# Patient Record
Sex: Male | Born: 1950 | Race: White | Hispanic: No | Marital: Married | Smoking: Never smoker
Health system: Southern US, Community
[De-identification: ages and names within clinical notes are randomized; demographics above are authoritative.]

## PROBLEM LIST (undated history)

## (undated) ENCOUNTER — Emergency Department (HOSPITAL_BASED_OUTPATIENT_CLINIC_OR_DEPARTMENT_OTHER): Admission: EM | Payer: Self-pay

## (undated) DIAGNOSIS — C801 Malignant (primary) neoplasm, unspecified: Secondary | ICD-10-CM

## (undated) DIAGNOSIS — R7303 Prediabetes: Secondary | ICD-10-CM

## (undated) DIAGNOSIS — N200 Calculus of kidney: Secondary | ICD-10-CM

## (undated) DIAGNOSIS — F419 Anxiety disorder, unspecified: Secondary | ICD-10-CM

## (undated) DIAGNOSIS — R0602 Shortness of breath: Secondary | ICD-10-CM

## (undated) DIAGNOSIS — J189 Pneumonia, unspecified organism: Secondary | ICD-10-CM

## (undated) DIAGNOSIS — I10 Essential (primary) hypertension: Secondary | ICD-10-CM

## (undated) DIAGNOSIS — K219 Gastro-esophageal reflux disease without esophagitis: Secondary | ICD-10-CM

## (undated) DIAGNOSIS — J45909 Unspecified asthma, uncomplicated: Secondary | ICD-10-CM

## (undated) HISTORY — PX: MRI: SHX5353

## (undated) HISTORY — PX: ESOPHAGOGASTRODUODENOSCOPY (EGD) WITH ESOPHAGEAL DILATION: SHX5812

## (undated) HISTORY — PX: CYSTOSCOPY: SHX5120

---

## 2013-03-03 ENCOUNTER — Encounter: Payer: Self-pay | Admitting: Gastroenterology

## 2013-03-07 ENCOUNTER — Ambulatory Visit: Payer: Self-pay | Admitting: Gastroenterology

## 2013-03-10 ENCOUNTER — Ambulatory Visit (HOSPITAL_BASED_OUTPATIENT_CLINIC_OR_DEPARTMENT_OTHER)
Admission: EM | Admit: 2013-03-10 | Discharge: 2013-03-10 | Disposition: A | Discharge status: Other Acute Inpatient Hospital | Payer: BC Managed Care – PPO | Attending: Emergency Medicine | Admitting: Emergency Medicine

## 2013-03-10 ENCOUNTER — Encounter (HOSPITAL_COMMUNITY): Payer: Self-pay | Admitting: *Deleted

## 2013-03-10 ENCOUNTER — Emergency Department (HOSPITAL_BASED_OUTPATIENT_CLINIC_OR_DEPARTMENT_OTHER)
Admission: EM | Admit: 2013-03-10 | Discharge: 2013-03-10 | Disposition: A | Discharge status: Other Acute Inpatient Hospital | Payer: BC Managed Care – PPO | Source: Home / Self Care | Attending: Emergency Medicine | Admitting: Emergency Medicine

## 2013-03-10 ENCOUNTER — Encounter (HOSPITAL_BASED_OUTPATIENT_CLINIC_OR_DEPARTMENT_OTHER): Payer: Self-pay | Admitting: Family Medicine

## 2013-03-10 ENCOUNTER — Encounter (HOSPITAL_COMMUNITY)
Admission: EM | Disposition: A | Discharge status: Other Acute Inpatient Hospital | Payer: Self-pay | Source: Home / Self Care | Attending: Gastroenterology

## 2013-03-10 ENCOUNTER — Other Ambulatory Visit: Payer: Self-pay | Admitting: Gastroenterology

## 2013-03-10 DIAGNOSIS — R1310 Dysphagia, unspecified: Secondary | ICD-10-CM

## 2013-03-10 DIAGNOSIS — R1110 Vomiting, unspecified: Secondary | ICD-10-CM | POA: Insufficient documentation

## 2013-03-10 DIAGNOSIS — T18108A Unspecified foreign body in esophagus causing other injury, initial encounter: Secondary | ICD-10-CM | POA: Insufficient documentation

## 2013-03-10 DIAGNOSIS — IMO0002 Reserved for concepts with insufficient information to code with codable children: Secondary | ICD-10-CM | POA: Insufficient documentation

## 2013-03-10 DIAGNOSIS — K209 Esophagitis, unspecified without bleeding: Secondary | ICD-10-CM | POA: Insufficient documentation

## 2013-03-10 HISTORY — DX: Calculus of kidney: N20.0

## 2013-03-10 HISTORY — DX: Unspecified asthma, uncomplicated: J45.909

## 2013-03-10 HISTORY — DX: Essential (primary) hypertension: I10

## 2013-03-10 HISTORY — PX: ESOPHAGOGASTRODUODENOSCOPY (EGD) WITH ESOPHAGEAL DILATION: SHX5812

## 2013-03-10 HISTORY — DX: Gastro-esophageal reflux disease without esophagitis: K21.9

## 2013-03-10 HISTORY — DX: Shortness of breath: R06.02

## 2013-03-10 SURGERY — ESOPHAGOGASTRODUODENOSCOPY (EGD) WITH ESOPHAGEAL DILATION
Anesthesia: Moderate Sedation

## 2013-03-10 MED ORDER — FENTANYL CITRATE 0.05 MG/ML IJ SOLN
INTRAMUSCULAR | Status: AC
  Filled 2013-03-10: qty 4

## 2013-03-10 MED ORDER — MIDAZOLAM HCL 10 MG/2ML IJ SOLN
INTRAMUSCULAR | Status: DC | PRN
  Administered 2013-03-10 (×5): 2 mg via INTRAVENOUS

## 2013-03-10 MED ORDER — DIPHENHYDRAMINE HCL 50 MG/ML IJ SOLN
INTRAMUSCULAR | Status: AC
  Filled 2013-03-10: qty 1

## 2013-03-10 MED ORDER — SODIUM CHLORIDE 0.9 % IV SOLN
INTRAVENOUS | Status: DC
  Administered 2013-03-10: 50 mL via INTRAVENOUS
  Administered 2013-03-10: 500 mL via INTRAVENOUS

## 2013-03-10 MED ORDER — FENTANYL CITRATE 0.05 MG/ML IJ SOLN
INTRAMUSCULAR | Status: DC | PRN
  Administered 2013-03-10 (×4): 25 ug via INTRAVENOUS

## 2013-03-10 MED ORDER — DIPHENHYDRAMINE HCL 50 MG/ML IJ SOLN
INTRAMUSCULAR | Status: DC | PRN
  Administered 2013-03-10 (×2): 25 mg via INTRAVENOUS

## 2013-03-10 MED ORDER — BUTAMBEN-TETRACAINE-BENZOCAINE 2-2-14 % EX AERO
INHALATION_SPRAY | CUTANEOUS | Status: DC | PRN
  Administered 2013-03-10: 2 via TOPICAL

## 2013-03-10 MED ORDER — MIDAZOLAM HCL 10 MG/2ML IJ SOLN
INTRAMUSCULAR | Status: AC
  Filled 2013-03-10: qty 4

## 2013-03-10 NOTE — H&P (Signed)
  Pleasant 62 year old gentleman seen at the endoscopy unit today on referral from the [facility], because of progressive dysphagia symptoms over the past 2 weeks. He was seen in my office by my partner, Dr. Dulce Sellar, just a few days ago because the symptoms and outpatient dilatation had been arranged, but realistically, he's been unable to sleep the past several nights because of gagging and regurgitation.  He is experiencing difficulty with both solids and liquids. He has had a 60 pound weight loss in the past several months, some of which is intentional by eating healthier. He has not had radiographic evaluation of his esophagus. Roughly 20 years ago, he had endoscopic dilatation on several occasions, in association with recurrent food impactions, but had been done well until a couple of months ago when his dysphagia symptoms became more pronounced and particularly bad over the past couple of weeks. He does not really suffer from indigestion or heartburn but has had a lot of regurgitation which could be obstructive in origin.   Past medical history:  Allergies: None  Outpatient medications: Flovent inhaler, when necessary albuterol inhaler  Chronic medical illnesses asthma. Prior history of esophageal ring or stricture causing dysphagia and necessitating dilatation. History of kidney stone.  Physical exam:  A stocky, pleasant, articulate Caucasian male in no distress. Anicteric no pallor. Chest pertinent for multiple rhonchi, especially at the right base, but there is no respiratory distress and only an occasional cough during the exam. Heart unremarkable. Abdomen obese but without guarding, mass effect, or tenderness.  Impression: Esophageal dysphagia. Weight loss.  Plan: Upper endoscopy with possible retrieval of retained food if foreign body is present, possible esophageal dilatation if a stricture is confirmed and the mucosa is not too inflamed, and possible biopsies  if there is evidence of a mass effect. The risks of this procedure with particular reference to aspiration, esophageal perforation, cardiopulmonary problems, or bleeding were discussed with the patient and his wife and they are agreeable to proceed. They understand that, depending on what we find, we may not be able to achieve definitive dilatation today.  Florencia Reasons, M.D. [phone]

## 2013-03-10 NOTE — ED Provider Notes (Signed)
   History    CSN: [id number] Arrival date & time 03/10/13  1140  First MD Initiated Contact with Patient 03/10/13 1156     Chief Complaint  Patient presents with  . Dysphagia   (Consider location/radiation/quality/duration/timing/severity/associated sxs/prior Treatment) The history is provided by the patient.   Pt presents to the ED for difficulty swallowing.  Pt is established with [facility], Dr. Dulce Sellar-- states he scheduled for esophageal dilation on Friday which he has had done before.  Over the past few days he has felt miserable-- unable to keep food down, unable to lay flat to sleep, continuously vomiting, etc.  Emesis is non-bloody, non-bilious.  States when he attempts to lay flat to sleep, he feels like he is going to suffocate.  No fevers, sweats, or chills.  Contacted his GI office this morning regarding sx and was told to come to the ED to possibly have procedure done earlier.  No past medical history on file. No past surgical history on file. No family history on file. History  Substance Use Topics  . Smoking status: Not on file  . Smokeless tobacco: Not on file  . Alcohol Use: Not on file    Review of Systems  HENT: Positive for trouble swallowing.   Gastrointestinal: Positive for vomiting.  All other systems reviewed and are negative.    Allergies  Review of patient's allergies indicates not on file.  Home Medications  No current outpatient prescriptions on file. BP 140/86  Pulse 74  Temp(Src) 97.9 F (36.6 C) (Oral)  Resp 18  Ht 6\' 2"  (1.88 m)  Wt 268 lb (121.564 kg)  BMI 34.39 kg/m2  SpO2 99%  Physical Exam  Nursing note and vitals reviewed. Constitutional: He is oriented to person, place, and time. He appears well-developed and well-nourished.  HENT:  Head: Normocephalic and atraumatic.  Mouth/Throat: Uvula is midline, oropharynx is clear and moist and mucous membranes are normal. No oropharyngeal exudate, posterior oropharyngeal edema, posterior  oropharyngeal erythema or tonsillar abscesses.  No visible oropharyngeal obstruction or food bolus; no oropharyngeal edema, handling secretions appropriately  Eyes: Conjunctivae and EOM are normal. Pupils are equal, round, and reactive to light.  Neck: Normal range of motion. Neck supple.  Cardiovascular: Normal rate, regular rhythm and normal heart sounds.   Pulmonary/Chest: Effort normal and breath sounds normal.  Abdominal: Soft. Bowel sounds are normal. There is no tenderness. There is no guarding.  Musculoskeletal: Normal range of motion.  Neurological: He is alert and oriented to person, place, and time.  Skin: Skin is warm and dry.  Psychiatric: He has a normal mood and affect.    ED Course  Procedures (including critical care time) Labs Reviewed - No data to display No results found.  1. Dysphagia     MDM   Consulted GI, Dr. Matthias Hughs-- they will do his procedure today at 3pm at [facility] long endo suite. Discussed this with pt, he would like to go by POV.  Given address and phone number for endo suite if any problems occur.  Garlon Hatchet, PA-C 03/10/13 1309

## 2013-03-10 NOTE — ED Notes (Signed)
Pt c/o difficulty swallowing and is scheduled for outpt dilation with Dr. Dulce Sellar at [facility] this Friday. Pt sts he would like to get procedure done sooner due to inability to keep food down and unable to sleep.

## 2013-03-10 NOTE — Op Note (Signed)
[facility] [address] [place], [address]   ENDOSCOPY PROCEDURE REPORT  PATIENT: Alfred Blair  MR#: [id number] BIRTHDATE: 1951-01-14 , 61  yrs. old GENDER: Male ENDOSCOPIST:Rashawnda Gaba, MD REFERRED BY:  Dr. Ihor Dow, Dr. Dulce Sellar PROCEDURE DATE:  03/10/2013 PROCEDURE:      Upper endoscopy, Retrieval of esophageal foreign body, biopsies ASA CLASS: INDICATIONS:   severe dysphagia symptoms, accelerated over the past several months and in particular the past several weeks. Barely able to get liquids down. His approximately 20 years status post previous esophageal dilatation performed in [place] MEDICATION:    Benadryl 50 mg IV, fentanyl 100 mcg IV, Versed 10 mg IV TOPICAL ANESTHETIC:    Cetacaine spray x2  DESCRIPTION OF PROCEDURE:  the patient came over from the [facility]. I discussed the procedure with the patient and his wife, including possible complications. He provided written consent. A fluid bolus was given to help prevent hypotension, because it was likely he had not had adequate liquid intake in recent days. Time out was performed. He was then sedated with the above medications prior to and during the course of the procedure, without clinical instability.  The Pentax video endoscope was passed under direct vision. The larynx looked normal. The esophagus was entered without difficulty.  Immediately upon entering the esophagus, it was noted that there was a fair amount of amorphous food debris in the esophagus, which was meticulously removed by approximately 6 passages of a Roth retrieval basket, after which the small amount of remaining food could be pushed into the stomach without difficulty.  Careful inspection of the region of the gastroesophageal junction showed no evidence of a ring or stricture. The scope met rubbery, smooth resistance as it passed through the area of the lower esophageal sphincter muscle,  without any discrete "pop" or "click" to suggest passage or fracture of an esophageal ring.  There was slight variation in the color of the distal esophageal mucosa, raising the question of possible Barrett's esophagus, so biopsies were obtained from the area that had mucosa that was somewhat darker in color. It was not the classic appearance of Barrett's, however.  I also obtained biopsies 5 cm above the GE junction and in the midesophagus at approximately 30 cm, to check for eosinophilic esophagitis.  The stomach contained a small amount of bile but was otherwise normal. A retroflexed view of the cardia was unremarkable. Specifically, there was no evidence of a cardia tumor to be causing this patient secondary achalasia.  The pylorus, duodenal bulb, and second duodenum looked normal.    I elected not to do esophageal dilatation because of the absence of any demonstrable stricture and the concern of possible alternative etiologies including eosinophilic esophagitis which would put the patient at greater risk for esophageal perforation if dilatation were performed.  The patient tolerated the procedure very well, despite a previous history of having been combative during his previous endoscopy in [place] (by his report).    COMPLICATIONS: None  ENDOSCOPIC IMPRESSION:  1. Esophageal food impaction, successfully removed 2. Findings at the GE junction suggestive of possible hypertonicity of the lower esophageal sphincter, or perhaps even achalasia. However, the patient's esophagus was not dilated. 3. Mucosal color changes in the distal esophagus suggestive of possible Barrett's esophagus, path pending. 4. Biopsies obtained to look for eosinophilic esophagitis, although the patient did not have multiple "stacked rings" in the esophageal body.   RECOMMENDATIONS:  1. Liquid diet until further notice 2. Barium  swallow with tablet in the next day or 2, to get a better idea  of his esophageal motility and emptying dynamics 3. Await pathology results on current biopsies 4. The patient will followup with Dr. Dulce Sellar to decide on the next course of action, depending on the above results. Realistically, he probably cannot have esophageal dilatation (if it is felt to be needed) until the esophageal biopsy sites have had a chance to heal    _______________________________ eSigned:  Bernette Redbird, MD 03/10/2013 5:10 PM    PATIENT NAME:  Alfred Blair MR#: [id number]

## 2013-03-11 ENCOUNTER — Encounter (HOSPITAL_COMMUNITY): Payer: Self-pay | Admitting: Gastroenterology

## 2013-03-11 NOTE — ED Provider Notes (Signed)
Medical screening examination/treatment/procedure(s) were performed by non-physician practitioner and as supervising physician I was immediately available for consultation/collaboration.  Geoffery Lyons, MD 03/11/13 [phone]

## 2013-03-13 ENCOUNTER — Other Ambulatory Visit (HOSPITAL_COMMUNITY): Payer: Self-pay | Admitting: Gastroenterology

## 2013-03-13 DIAGNOSIS — R1310 Dysphagia, unspecified: Secondary | ICD-10-CM

## 2013-03-14 ENCOUNTER — Other Ambulatory Visit (HOSPITAL_COMMUNITY): Payer: BC Managed Care – PPO

## 2013-03-17 ENCOUNTER — Ambulatory Visit (HOSPITAL_COMMUNITY)
Admission: RE | Admit: 2013-03-17 | Discharge: 2013-03-17 | Disposition: A | Discharge status: Home / Self Care | Payer: BC Managed Care – PPO | Source: Ambulatory Visit | Attending: Gastroenterology | Admitting: Gastroenterology

## 2013-03-17 DIAGNOSIS — R1110 Vomiting, unspecified: Secondary | ICD-10-CM | POA: Insufficient documentation

## 2013-03-17 DIAGNOSIS — R1310 Dysphagia, unspecified: Secondary | ICD-10-CM

## 2013-04-15 ENCOUNTER — Telehealth: Payer: Self-pay | Admitting: Gastroenterology

## 2013-04-15 NOTE — Telephone Encounter (Signed)
GI records received and declined referral at this time

## 2013-04-17 ENCOUNTER — Telehealth: Payer: Self-pay | Admitting: Gastroenterology

## 2013-04-17 NOTE — Telephone Encounter (Signed)
rec'd records from [place], [place] [address] to Dr Jarold Motto

## 2018-09-03 DIAGNOSIS — Z6841 Body Mass Index (BMI) 40.0 and over, adult: Secondary | ICD-10-CM | POA: Diagnosis not present

## 2018-09-03 DIAGNOSIS — I10 Essential (primary) hypertension: Secondary | ICD-10-CM | POA: Diagnosis not present

## 2018-09-03 DIAGNOSIS — J00 Acute nasopharyngitis [common cold]: Secondary | ICD-10-CM | POA: Diagnosis not present

## 2018-09-03 DIAGNOSIS — J45901 Unspecified asthma with (acute) exacerbation: Secondary | ICD-10-CM | POA: Diagnosis not present

## 2018-09-03 DIAGNOSIS — E559 Vitamin D deficiency, unspecified: Secondary | ICD-10-CM | POA: Diagnosis not present

## 2018-09-03 DIAGNOSIS — Z131 Encounter for screening for diabetes mellitus: Secondary | ICD-10-CM | POA: Diagnosis not present

## 2018-09-03 DIAGNOSIS — Z13220 Encounter for screening for lipoid disorders: Secondary | ICD-10-CM | POA: Diagnosis not present

## 2018-10-11 DIAGNOSIS — J069 Acute upper respiratory infection, unspecified: Secondary | ICD-10-CM | POA: Diagnosis not present

## 2018-10-11 DIAGNOSIS — I10 Essential (primary) hypertension: Secondary | ICD-10-CM | POA: Diagnosis not present

## 2018-11-02 ENCOUNTER — Encounter (HOSPITAL_COMMUNITY): Payer: Self-pay

## 2018-11-02 ENCOUNTER — Emergency Department (HOSPITAL_COMMUNITY)
Admission: EM | Admit: 2018-11-02 | Discharge: 2018-11-02 | Disposition: A | Discharge status: Home / Self Care | Payer: Medicare Other | Attending: Emergency Medicine | Admitting: Emergency Medicine

## 2018-11-02 ENCOUNTER — Other Ambulatory Visit: Payer: Self-pay

## 2018-11-02 ENCOUNTER — Emergency Department (HOSPITAL_COMMUNITY): Payer: Medicare Other

## 2018-11-02 DIAGNOSIS — J45909 Unspecified asthma, uncomplicated: Secondary | ICD-10-CM | POA: Insufficient documentation

## 2018-11-02 DIAGNOSIS — J189 Pneumonia, unspecified organism: Secondary | ICD-10-CM | POA: Diagnosis not present

## 2018-11-02 DIAGNOSIS — R05 Cough: Secondary | ICD-10-CM | POA: Diagnosis not present

## 2018-11-02 DIAGNOSIS — Z87442 Personal history of urinary calculi: Secondary | ICD-10-CM | POA: Diagnosis not present

## 2018-11-02 MED ORDER — HYDROCOD POLST-CPM POLST ER 10-8 MG/5ML PO SUER: 5.0000 mL | Freq: Every evening | ORAL | 0 refills | Status: DC | PRN

## 2018-11-02 MED ORDER — DOXYCYCLINE HYCLATE 100 MG PO CAPS: 100.0000 mg | ORAL_CAPSULE | Freq: Two times a day (BID) | ORAL | 0 refills | Status: DC

## 2018-11-02 MED ORDER — ALBUTEROL SULFATE (2.5 MG/3ML) 0.083% IN NEBU
5.0000 mg | INHALATION_SOLUTION | Freq: Once | RESPIRATORY_TRACT | Status: AC
  Administered 2018-11-02: 5 mg via RESPIRATORY_TRACT
  Filled 2018-11-02: qty 6

## 2018-11-02 MED ORDER — PREDNISONE 20 MG PO TABS
60.0000 mg | ORAL_TABLET | Freq: Once | ORAL | Status: AC
  Administered 2018-11-02: 60 mg via ORAL
  Filled 2018-11-02: qty 3

## 2018-11-02 MED ORDER — PREDNISONE 50 MG PO TABS: ORAL_TABLET | ORAL | 0 refills | Status: DC

## 2018-11-02 MED ORDER — IPRATROPIUM-ALBUTEROL 0.5-2.5 (3) MG/3ML IN SOLN
3.0000 mL | Freq: Once | RESPIRATORY_TRACT | Status: AC
  Administered 2018-11-02: 3 mL via RESPIRATORY_TRACT
  Filled 2018-11-02: qty 3

## 2018-11-02 MED ORDER — HYDROCOD POLST-CPM POLST ER 10-8 MG/5ML PO SUER
5.0000 mL | Freq: Once | ORAL | Status: AC
  Administered 2018-11-02: 5 mL via ORAL
  Filled 2018-11-02: qty 5

## 2018-11-02 MED ORDER — ALBUTEROL SULFATE HFA 108 (90 BASE) MCG/ACT IN AERS
2.0000 | INHALATION_SPRAY | RESPIRATORY_TRACT | Status: DC | PRN
  Administered 2018-11-02: 2 via RESPIRATORY_TRACT
  Filled 2018-11-02: qty 6.7

## 2018-11-02 MED ORDER — BENZONATATE 100 MG PO CAPS: 100.0000 mg | ORAL_CAPSULE | Freq: Three times a day (TID) | ORAL | 0 refills | Status: DC

## 2018-11-02 NOTE — Discharge Instructions (Addendum)
The Tussionex for cough can make you sleepy. Follow up with your doctor in the next few days. If your symptoms worsen and you develop shortness of breath, chest pain, high fever that does not resolve with tylenol or ibuprofen, nausea and vomiting or other problems, return here immediately.

## 2018-11-02 NOTE — ED Provider Notes (Signed)
[facility]-EMERGENCY DEPT Provider Note   CSN: [id number] Arrival date & time: 11/02/18  1332    History   Chief Complaint Chief Complaint  Patient presents with  . URI    HPI VIJAY DURFLINGER is a 68 y.o. male who presents to the ED with cough, congestion, fever and chills.      HPI  Comes in today with a week long history, starting saturday with sinus congestion and runny nose that then progressed to chills, SOB, wheezing and productive cough with chest congestion as of Thursday. History of asthma and last dose of flovent was yesterday. States he has had pneumonia in the passed and feels as if he is progressing from a regular sinus/URI. Patient reports he was treated for a sinus infection with Augmentin just after Christmas and then a few weeks ago had a viral illness that he did not take antibiotics for. Patient reports his job requires him to travel via airplane so he is exposed to a lot of people.   Past Medical History:  Diagnosis Date  . Asthma   . GERD (gastroesophageal reflux disease)   . Kidney stone   . Shortness of breath     There are no active problems to display for this patient.   Past Surgical History:  Procedure Laterality Date  . ESOPHAGOGASTRODUODENOSCOPY (EGD) WITH ESOPHAGEAL DILATION    . ESOPHAGOGASTRODUODENOSCOPY (EGD) WITH ESOPHAGEAL DILATION N/A 03/10/2013   Procedure: ESOPHAGOGASTRODUODENOSCOPY (EGD) WITH ESOPHAGEAL DILATION;  Surgeon: Florencia Reasons, MD;  Location: [facility] ENDOSCOPY;  Service: Endoscopy;  Laterality: N/A;  possible through-the-scope esophageal dilatation, and/or removal of foreign body        Home Medications    Prior to Admission medications   Medication Sig Start Date End Date Taking? Authorizing Provider  albuterol (PROVENTIL HFA;VENTOLIN HFA) 108 (90 BASE) MCG/ACT inhaler Inhale 2 puffs into the lungs every 6 (six) hours as needed for wheezing.    [provider]  benzonatate (TESSALON) 100 MG  capsule Take 1 capsule (100 mg total) by mouth every 8 (eight) hours. 11/02/18   Janne Napoleon, NP  chlorpheniramine-HYDROcodone [facility] ER) 10-8 MG/5ML SUER Take 5 mLs by mouth at bedtime as needed for cough. 11/02/18   Janne Napoleon, NP  doxycycline (VIBRAMYCIN) 100 MG capsule Take 1 capsule (100 mg total) by mouth 2 (two) times daily. 11/02/18   Janne Napoleon, NP  Fluticasone Propionate, Inhal, (FLOVENT IN) Inhale into the lungs.    [provider]  predniSONE (DELTASONE) 50 MG tablet Starting 11/03/2018 take one tablet po daily 11/02/18   Janne Napoleon, NP    Family History History reviewed. No pertinent family history.  Social History Social History   Tobacco Use  . Smoking status: Never Smoker  . Smokeless tobacco: Never Used  Substance Use Topics  . Alcohol use: Yes    Comment: very little  . Drug use: No     Allergies   Patient has no known allergies.   Review of Systems Review of Systems  Constitutional: Positive for chills.  HENT: Positive for congestion, rhinorrhea, sinus pressure and sore throat.   Respiratory: Positive for cough, shortness of breath and wheezing.   Gastrointestinal: Negative for diarrhea, nausea and vomiting.     Physical Exam Updated Vital Signs BP (!) 168/86 (BP Location: Left Arm)   Pulse 92   Temp 97.8 F (36.6 C) (Oral)   Resp 20   SpO2 93%   Physical Exam Vitals signs  and nursing note reviewed.  Constitutional:      General: He is not in acute distress.    Appearance: He is well-developed. He is obese.  HENT:     Head: Normocephalic.     Right Ear: Tympanic membrane normal.     Left Ear: Tympanic membrane normal.     Nose: Mucosal edema and congestion present.     Right Sinus: Maxillary sinus tenderness present.     Left Sinus: Maxillary sinus tenderness present.     Mouth/Throat:     Mouth: Mucous membranes are moist.     Pharynx: Oropharynx is clear.  Eyes:     Extraocular Movements: Extraocular movements  intact.     Conjunctiva/sclera: Conjunctivae normal.  Neck:     Musculoskeletal: Neck supple.  Cardiovascular:     Rate and Rhythm: Normal rate and regular rhythm.  Pulmonary:     Effort: Pulmonary effort is normal.     Breath sounds: Examination of the right-lower field reveals decreased breath sounds. Examination of the left-lower field reveals decreased breath sounds. Decreased breath sounds present.     Comments: Occasional wheezing. Abdominal:     Palpations: Abdomen is soft.     Tenderness: There is no abdominal tenderness.  Musculoskeletal: Normal range of motion.  Skin:    General: Skin is warm and dry.  Neurological:     Mental Status: He is alert and oriented to person, place, and time.  Psychiatric:        Mood and Affect: Mood normal.      ED Treatments / Results  Labs (all labs ordered are listed, but only abnormal results are displayed) Labs Reviewed - No data to display  Radiology Dg Chest 2 View  Result Date: 11/02/2018 CLINICAL DATA:  Fever and chills.  Productive cough. EXAM: CHEST - 2 VIEW COMPARISON:  None. FINDINGS: Heart is mildly enlarged. There is no edema or effusions. Mild bibasilar airspace disease is present. No other significant consolidation is present. The visualized soft tissues and bony thorax are within normal limits. IMPRESSION: 1. Mild bibasilar airspace disease likely reflects atelectasis. Infection is considered less likely Electronically Signed   By: Marin Roberts M.D.   On: 11/02/2018 15:26    Procedures Procedures (including critical care time)  Medications Ordered in ED Medications  albuterol (PROVENTIL HFA;VENTOLIN HFA) 108 (90 Base) MCG/ACT inhaler 2 puff (2 puffs Inhalation Given 11/02/18 1623)  chlorpheniramine-HYDROcodone (TUSSIONEX) 10-8 MG/5ML suspension 5 mL (has no administration in time range)  albuterol (PROVENTIL) (2.5 MG/3ML) 0.083% nebulizer solution 5 mg (has no administration in time range)  ipratropium-albuterol  (DUONEB) 0.5-2.5 (3) MG/3ML nebulizer solution 3 mL (3 mLs Nebulization Given 11/02/18 1420)  predniSONE (DELTASONE) tablet 60 mg (60 mg Oral Given 11/02/18 1622)   Patient reports improvement after neb treatment.   Initial Impression / Assessment and Plan / ED Course  I have reviewed the triage vital signs and the nursing notes. Dr. Adela Lank reviewed x-ray and evaluated the patient and discuss further testing (flu swab). Patient states that he does not have fever and if he develops fever and starts feeling worse he will return.   Final Clinical Impressions(s) / ED Diagnoses   Final diagnoses:  Community acquired pneumonia, unspecified laterality    ED Discharge Orders         Ordered    predniSONE (DELTASONE) 50 MG tablet     11/02/18 1602    benzonatate (TESSALON) 100 MG capsule  Every 8 hours  11/02/18 1602    chlorpheniramine-HYDROcodone (TUSSIONEX PENNKINETIC ER) 10-8 MG/5ML SUER  At bedtime PRN     11/02/18 1602    doxycycline (VIBRAMYCIN) 100 MG capsule  2 times daily     11/02/18 1628           Kerrie [place], NP 11/03/18 1014    Melene Plan, DO 11/03/18 1504

## 2018-11-02 NOTE — ED Triage Notes (Addendum)
Patient c/o sore throat that came on last weekend and became more of a sinus issue. Thursday congestion started in his chest, coughing up mucous (Green,thicck), shob, and wheezing. Patient reports using inhaler today with minimum relief.  Patient reports chills this morning.  Sore throat is now gone and just irritated when coughing.  Denies hx. Of blood clots.  Denies n/v or diarrhea.   Denies body aches or pain.   URI.   Coughing in triage.  C/O coughing keeping him up all night and not able to rest.  Able to speak in complete sentence.  A/Ox4 Ambulatory in triage.

## 2018-11-05 DIAGNOSIS — F419 Anxiety disorder, unspecified: Secondary | ICD-10-CM | POA: Diagnosis not present

## 2018-11-05 DIAGNOSIS — J45909 Unspecified asthma, uncomplicated: Secondary | ICD-10-CM | POA: Diagnosis not present

## 2018-11-05 DIAGNOSIS — R0602 Shortness of breath: Secondary | ICD-10-CM | POA: Diagnosis not present

## 2018-11-07 DIAGNOSIS — J45909 Unspecified asthma, uncomplicated: Secondary | ICD-10-CM | POA: Diagnosis not present

## 2018-11-09 DIAGNOSIS — R0689 Other abnormalities of breathing: Secondary | ICD-10-CM | POA: Diagnosis not present

## 2018-11-09 DIAGNOSIS — B348 Other viral infections of unspecified site: Secondary | ICD-10-CM | POA: Diagnosis not present

## 2018-11-09 DIAGNOSIS — R0601 Orthopnea: Secondary | ICD-10-CM | POA: Diagnosis not present

## 2018-11-09 DIAGNOSIS — R600 Localized edema: Secondary | ICD-10-CM | POA: Diagnosis not present

## 2018-11-09 DIAGNOSIS — Z20828 Contact with and (suspected) exposure to other viral communicable diseases: Secondary | ICD-10-CM | POA: Diagnosis not present

## 2018-11-09 DIAGNOSIS — J101 Influenza due to other identified influenza virus with other respiratory manifestations: Secondary | ICD-10-CM | POA: Diagnosis not present

## 2019-06-23 DIAGNOSIS — Z20828 Contact with and (suspected) exposure to other viral communicable diseases: Secondary | ICD-10-CM | POA: Diagnosis not present

## 2019-08-01 DIAGNOSIS — Z23 Encounter for immunization: Secondary | ICD-10-CM | POA: Diagnosis not present

## 2019-09-22 ENCOUNTER — Ambulatory Visit: Payer: Medicare Other | Attending: Internal Medicine

## 2019-09-22 DIAGNOSIS — Z23 Encounter for immunization: Secondary | ICD-10-CM

## 2019-09-22 NOTE — Progress Notes (Signed)
   Covid-19 Vaccination Clinic  Name:  Alfred Blair    MRN: [id number] DOB: [birth year]  09/22/2019  Alfred Blair was observed post Covid-19 immunization for 15 minutes without incidence. He was provided with Vaccine Information Sheet and instruction to access the V-Safe system.   Alfred Blair was instructed to call 911 with any severe reactions post vaccine: Marland Kitchen Difficulty breathing  . Swelling of your face and throat  . A fast heartbeat  . A bad rash all over your body  . Dizziness and weakness    Immunizations Administered    Name Date Dose VIS Date Route   Pfizer COVID-19 Vaccine 09/22/2019  9:52 AM 0.3 mL 08/08/2019 Intramuscular   Manufacturer: [employer], [employer]   Lot: [id number]   NDC: [address]-9

## 2019-10-02 DIAGNOSIS — Z20828 Contact with and (suspected) exposure to other viral communicable diseases: Secondary | ICD-10-CM | POA: Diagnosis not present

## 2019-10-02 DIAGNOSIS — Z03818 Encounter for observation for suspected exposure to other biological agents ruled out: Secondary | ICD-10-CM | POA: Diagnosis not present

## 2019-10-13 ENCOUNTER — Ambulatory Visit: Payer: Medicare Other | Attending: Internal Medicine

## 2019-10-13 DIAGNOSIS — Z23 Encounter for immunization: Secondary | ICD-10-CM | POA: Insufficient documentation

## 2019-10-13 NOTE — Progress Notes (Signed)
   Covid-19 Vaccination Clinic  Name:  Alfred Blair    MRN: [id number] DOB: [birth year]  10/13/2019  Mr. Alfred Blair was observed post Covid-19 immunization for 15 minutes without incidence. He was provided with Vaccine Information Sheet and instruction to access the V-Safe system.   Mr. Alfred Blair was instructed to call 911 with any severe reactions post vaccine: Marland Kitchen Difficulty breathing  . Swelling of your face and throat  . A fast heartbeat  . A bad rash all over your body  . Dizziness and weakness    Immunizations Administered    Name Date Dose VIS Date Route   Pfizer COVID-19 Vaccine 10/13/2019  9:58 AM 0.3 mL 08/08/2019 Intramuscular   Manufacturer: [employer], [employer]   Lot: [id number]   NDC: [address]-8

## 2019-10-15 DIAGNOSIS — Z20828 Contact with and (suspected) exposure to other viral communicable diseases: Secondary | ICD-10-CM | POA: Diagnosis not present

## 2019-10-15 DIAGNOSIS — Z03818 Encounter for observation for suspected exposure to other biological agents ruled out: Secondary | ICD-10-CM | POA: Diagnosis not present

## 2019-11-08 DIAGNOSIS — Z20828 Contact with and (suspected) exposure to other viral communicable diseases: Secondary | ICD-10-CM | POA: Diagnosis not present

## 2019-11-08 DIAGNOSIS — Z03818 Encounter for observation for suspected exposure to other biological agents ruled out: Secondary | ICD-10-CM | POA: Diagnosis not present

## 2020-01-17 DIAGNOSIS — Z03818 Encounter for observation for suspected exposure to other biological agents ruled out: Secondary | ICD-10-CM | POA: Diagnosis not present

## 2020-01-17 DIAGNOSIS — Z20828 Contact with and (suspected) exposure to other viral communicable diseases: Secondary | ICD-10-CM | POA: Diagnosis not present

## 2020-04-01 IMAGING — CR DG CHEST 2V
2 series · 2 of 2 positions shown · non-contrast
Comparison: None.

CLINICAL DATA: Fever and chills.  Productive cough.

EXAM:
CHEST - 2 VIEW

[w chest pa]
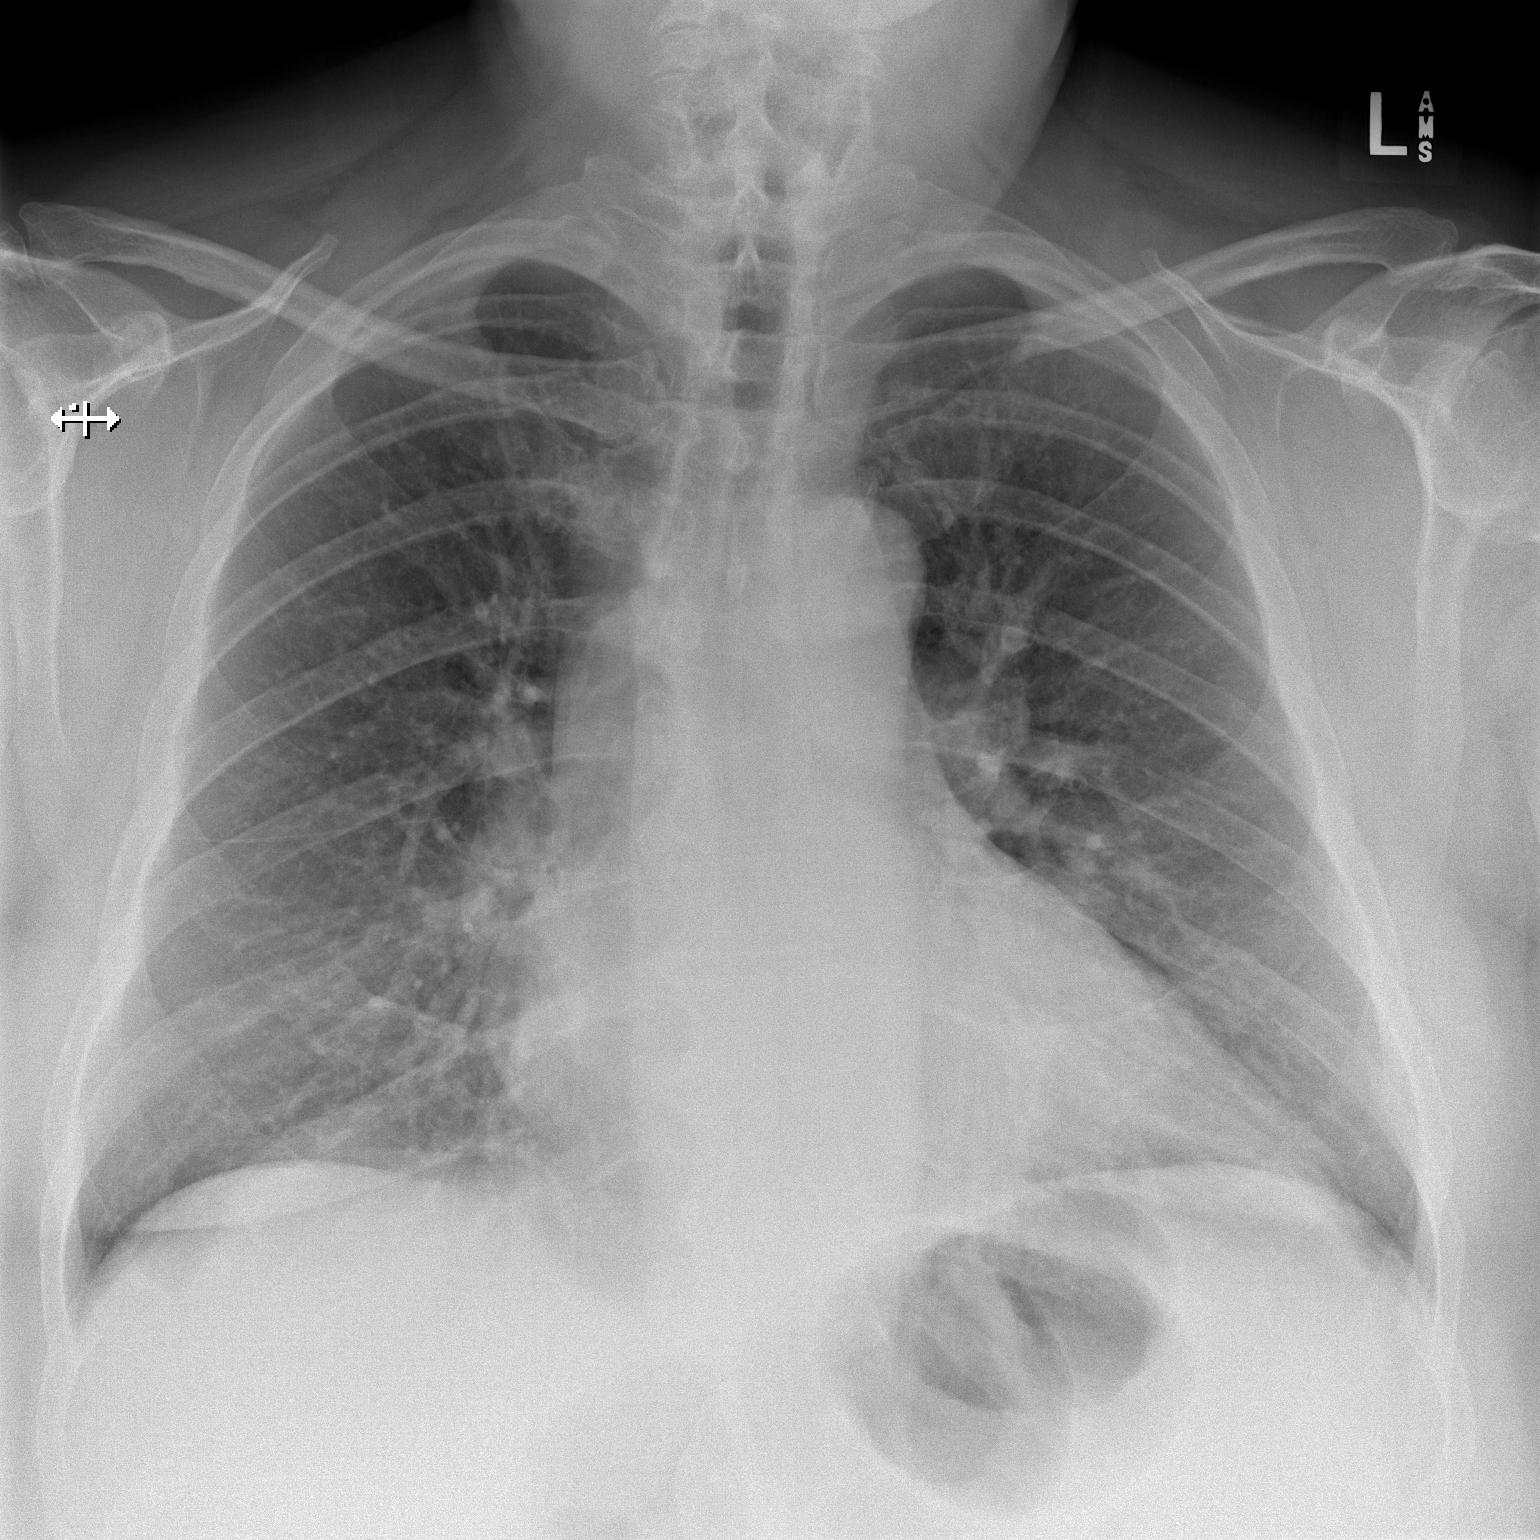

[w chest lat]
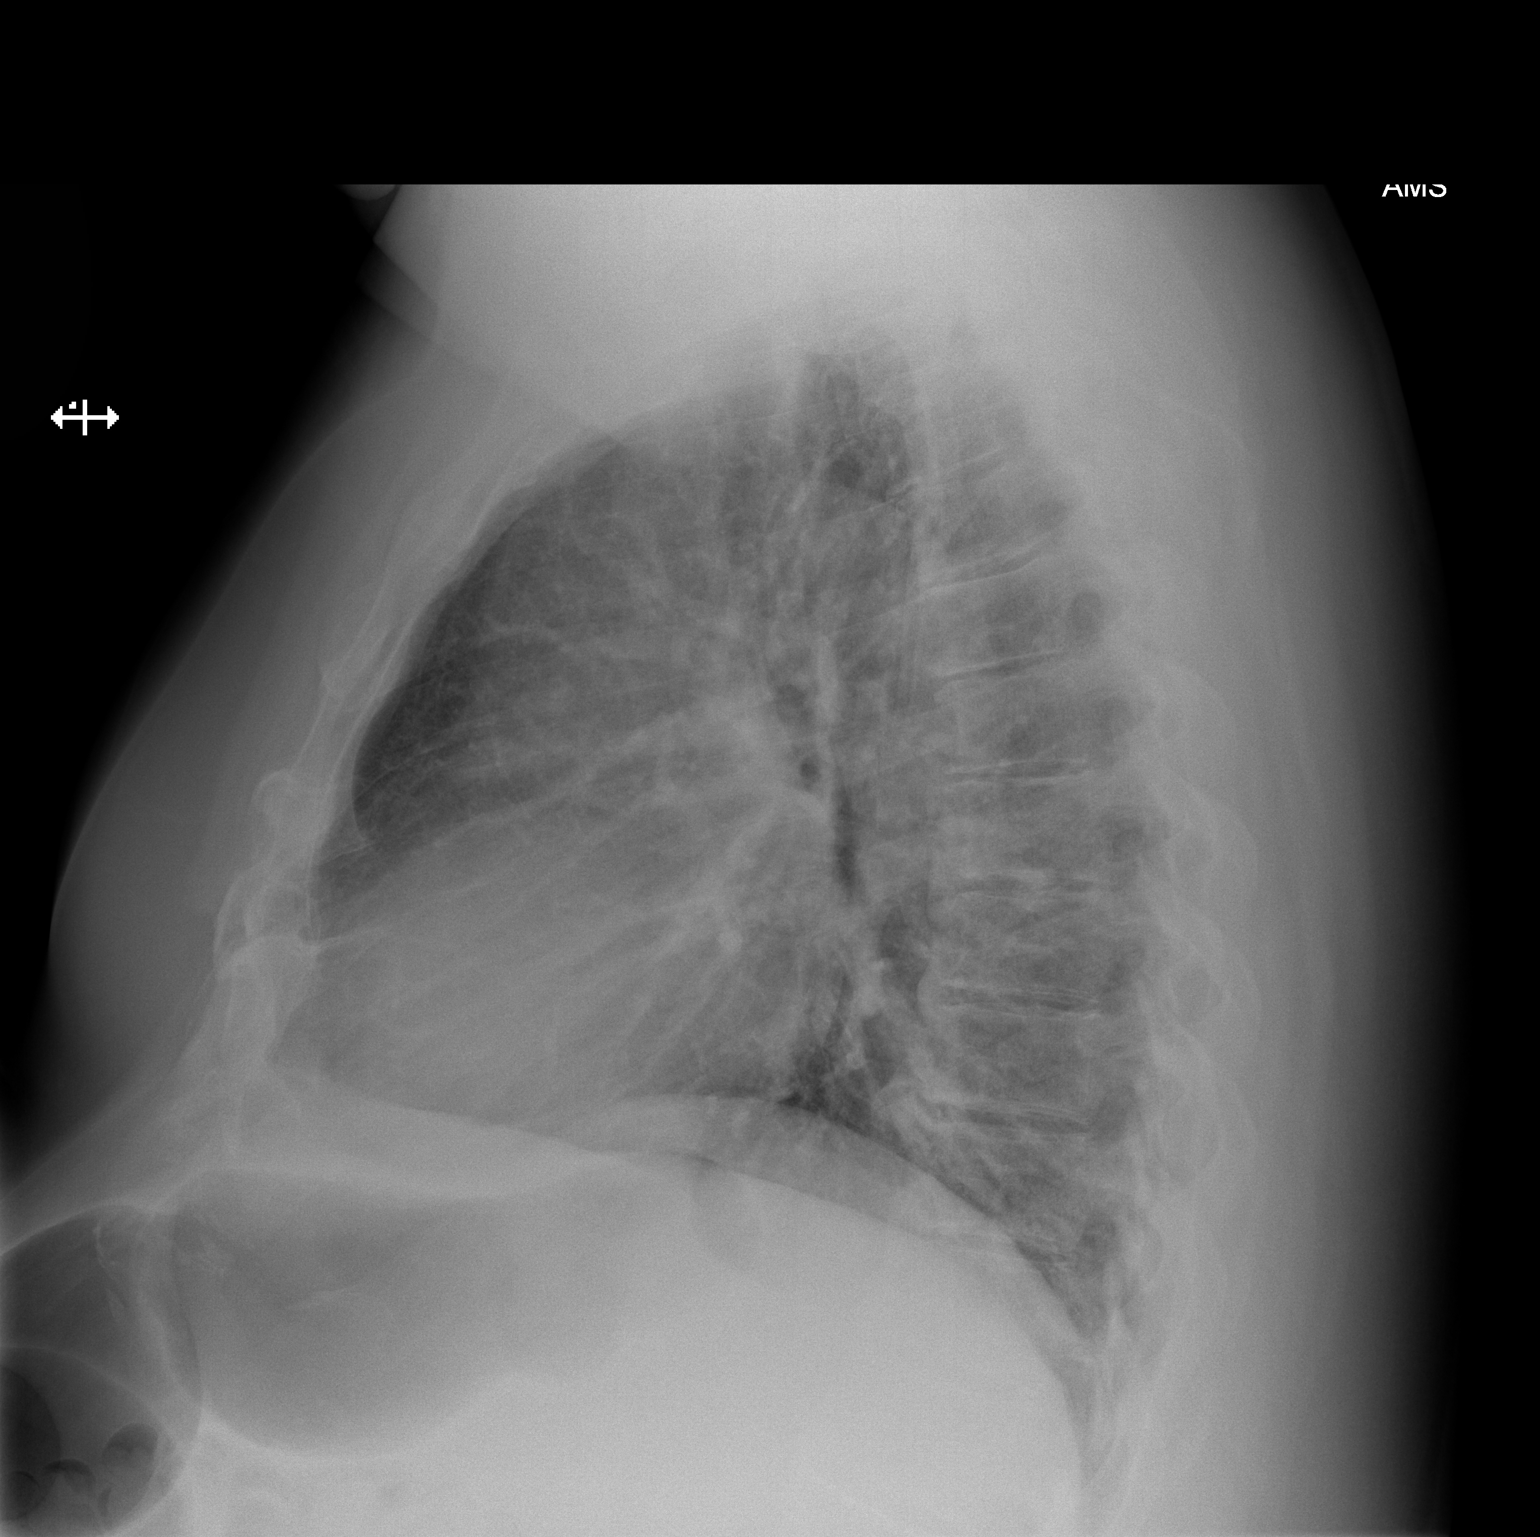

[2 of 2 positions shown; findings below may reference images not displayed]

FINDINGS: Heart is mildly enlarged. There is no edema or effusions. Mild
bibasilar airspace disease is present. No other significant
consolidation is present. The visualized soft tissues and bony
thorax are within normal limits.
IMPRESSION: 1. Mild bibasilar airspace disease likely reflects atelectasis.
Infection is considered less likely

## 2020-05-04 DIAGNOSIS — Z6841 Body Mass Index (BMI) 40.0 and over, adult: Secondary | ICD-10-CM | POA: Diagnosis not present

## 2020-05-04 DIAGNOSIS — F330 Major depressive disorder, recurrent, mild: Secondary | ICD-10-CM | POA: Diagnosis not present

## 2020-05-04 DIAGNOSIS — J4520 Mild intermittent asthma, uncomplicated: Secondary | ICD-10-CM | POA: Diagnosis not present

## 2020-05-04 DIAGNOSIS — L989 Disorder of the skin and subcutaneous tissue, unspecified: Secondary | ICD-10-CM | POA: Diagnosis not present

## 2020-05-04 DIAGNOSIS — Z7689 Persons encountering health services in other specified circumstances: Secondary | ICD-10-CM | POA: Diagnosis not present

## 2020-05-04 DIAGNOSIS — K219 Gastro-esophageal reflux disease without esophagitis: Secondary | ICD-10-CM | POA: Diagnosis not present

## 2020-05-04 DIAGNOSIS — F419 Anxiety disorder, unspecified: Secondary | ICD-10-CM | POA: Diagnosis not present

## 2020-05-24 DIAGNOSIS — Z23 Encounter for immunization: Secondary | ICD-10-CM | POA: Diagnosis not present

## 2020-07-05 DIAGNOSIS — Z125 Encounter for screening for malignant neoplasm of prostate: Secondary | ICD-10-CM | POA: Diagnosis not present

## 2020-07-05 DIAGNOSIS — J4520 Mild intermittent asthma, uncomplicated: Secondary | ICD-10-CM | POA: Diagnosis not present

## 2020-07-05 DIAGNOSIS — K219 Gastro-esophageal reflux disease without esophagitis: Secondary | ICD-10-CM | POA: Diagnosis not present

## 2020-07-07 DIAGNOSIS — R030 Elevated blood-pressure reading, without diagnosis of hypertension: Secondary | ICD-10-CM | POA: Diagnosis not present

## 2020-07-07 DIAGNOSIS — Z0000 Encounter for general adult medical examination without abnormal findings: Secondary | ICD-10-CM | POA: Diagnosis not present

## 2020-07-07 DIAGNOSIS — L989 Disorder of the skin and subcutaneous tissue, unspecified: Secondary | ICD-10-CM | POA: Diagnosis not present

## 2020-07-07 DIAGNOSIS — Z6841 Body Mass Index (BMI) 40.0 and over, adult: Secondary | ICD-10-CM | POA: Diagnosis not present

## 2020-07-07 DIAGNOSIS — J4520 Mild intermittent asthma, uncomplicated: Secondary | ICD-10-CM | POA: Diagnosis not present

## 2020-07-07 DIAGNOSIS — F330 Major depressive disorder, recurrent, mild: Secondary | ICD-10-CM | POA: Diagnosis not present

## 2020-07-07 DIAGNOSIS — F419 Anxiety disorder, unspecified: Secondary | ICD-10-CM | POA: Diagnosis not present

## 2020-07-07 DIAGNOSIS — K219 Gastro-esophageal reflux disease without esophagitis: Secondary | ICD-10-CM | POA: Diagnosis not present

## 2022-01-17 ENCOUNTER — Other Ambulatory Visit (HOSPITAL_BASED_OUTPATIENT_CLINIC_OR_DEPARTMENT_OTHER): Payer: Self-pay | Admitting: Emergency Medicine

## 2022-01-17 DIAGNOSIS — S72002A Fracture of unspecified part of neck of left femur, initial encounter for closed fracture: Secondary | ICD-10-CM

## 2022-01-19 ENCOUNTER — Ambulatory Visit (HOSPITAL_BASED_OUTPATIENT_CLINIC_OR_DEPARTMENT_OTHER)
Admission: RE | Admit: 2022-01-19 | Discharge: 2022-01-19 | Disposition: A | Discharge status: Home / Self Care | Payer: Medicare Other | Source: Ambulatory Visit | Attending: Emergency Medicine | Admitting: Emergency Medicine

## 2022-01-19 DIAGNOSIS — S72002A Fracture of unspecified part of neck of left femur, initial encounter for closed fracture: Secondary | ICD-10-CM | POA: Insufficient documentation

## 2022-01-20 ENCOUNTER — Other Ambulatory Visit (HOSPITAL_BASED_OUTPATIENT_CLINIC_OR_DEPARTMENT_OTHER): Payer: Medicare Other

## 2022-03-07 ENCOUNTER — Other Ambulatory Visit (HOSPITAL_COMMUNITY): Payer: Self-pay | Admitting: Emergency Medicine

## 2022-03-07 DIAGNOSIS — R600 Localized edema: Secondary | ICD-10-CM

## 2022-03-07 DIAGNOSIS — R609 Edema, unspecified: Secondary | ICD-10-CM

## 2023-01-12 ENCOUNTER — Encounter (HOSPITAL_BASED_OUTPATIENT_CLINIC_OR_DEPARTMENT_OTHER): Payer: Self-pay

## 2023-02-09 ENCOUNTER — Other Ambulatory Visit (HOSPITAL_BASED_OUTPATIENT_CLINIC_OR_DEPARTMENT_OTHER): Payer: Self-pay | Admitting: Emergency Medicine

## 2023-02-09 DIAGNOSIS — R600 Localized edema: Secondary | ICD-10-CM

## 2023-02-13 ENCOUNTER — Other Ambulatory Visit (HOSPITAL_BASED_OUTPATIENT_CLINIC_OR_DEPARTMENT_OTHER): Payer: Medicare Other

## 2023-03-14 ENCOUNTER — Ambulatory Visit (HOSPITAL_BASED_OUTPATIENT_CLINIC_OR_DEPARTMENT_OTHER): Payer: Medicare Other

## 2023-03-14 DIAGNOSIS — R600 Localized edema: Secondary | ICD-10-CM

## 2023-03-14 LAB — ECHOCARDIOGRAM COMPLETE
AR max vel: 2.28 cm2
AV Area VTI: 2.24 cm2
AV Area mean vel: 2.21 cm2
AV Mean grad: 5 mmHg
AV Peak grad: 9.6 mmHg
AV Vena cont: 0.26 cm
Ao pk vel: 1.55 m/s
Area-P 1/2: 3.28 cm2
P 1/2 time: 455 msec
S' Lateral: 3.69 cm

## 2023-03-19 NOTE — Progress Notes (Signed)
Sent message, via epic in basket, requesting orders in epic from surgeon.  

## 2023-03-22 ENCOUNTER — Ambulatory Visit: Payer: Self-pay | Admitting: Student

## 2023-03-22 DIAGNOSIS — E119 Type 2 diabetes mellitus without complications: Secondary | ICD-10-CM

## 2023-03-22 NOTE — Patient Instructions (Addendum)
SURGICAL WAITING ROOM VISITATION  Patients having surgery or a procedure may have no more than 2 support people in the waiting area - these visitors may rotate.    Children under the age of 83 must have an adult with them who is not the patient.  Due to an increase in RSV and influenza rates and associated hospitalizations, children ages 32 and under may not visit patients in [facility] hospitals.  If the patient needs to stay at the hospital during part of their recovery, the visitor guidelines for inpatient rooms apply. Pre-op nurse will coordinate an appropriate time for 1 support person to accompany patient in pre-op.  This support person may not rotate.    Please refer to the [facility] website for the visitor guidelines for Inpatients (after your surgery is over and you are in a regular room).    Your procedure is scheduled on: 04/05/23   Report to [facility] Main Entrance    Report to admitting at 7:50 AM   Call this number if you have problems the morning of surgery [phone]   Do not eat food :After Midnight.   After Midnight you may have the following liquids until 7:20 AM DAY OF SURGERY  Water Non-Citrus Juices (without pulp, NO RED-Apple, White grape, White cranberry) Black Coffee (NO MILK/CREAM OR CREAMERS, sugar ok)  Clear Tea (NO MILK/CREAM OR CREAMERS, sugar ok) regular and decaf                             Plain Jell-O (NO RED)                                           Fruit ices (not with fruit pulp, NO RED)                                     Popsicles (NO RED)                                                               Sports drinks like Gatorade (NO RED)                 The day of surgery:  Drink ONE (1) Pre-Surgery G2 at 7:20 AM the morning of surgery. Drink in one sitting. Do not sip.  This drink was given to you during your hospital  pre-op appointment visit. Nothing else to drink after completing the  Pre-Surgery G2.          If you  have questions, please contact your surgeon's office.   FOLLOW BOWEL PREP AND ANY ADDITIONAL PRE OP INSTRUCTIONS YOU RECEIVED FROM YOUR SURGEON'S OFFICE!!!     Oral Hygiene is also important to reduce your risk of infection.                                    Remember - BRUSH YOUR TEETH THE MORNING OF SURGERY WITH YOUR REGULAR TOOTHPASTE  DENTURES WILL BE REMOVED  PRIOR TO SURGERY PLEASE DO NOT APPLY "Poly grip" OR ADHESIVES!!!   Take these medicines the morning of surgery with A SIP OF WATER: Ecitalopram, Gabapentin, Tramadol   DO NOT TAKE ANY ORAL DIABETIC MEDICATIONS DAY OF YOUR SURGERY  How to Manage Your Diabetes Before and After Surgery  Why is it important to control my blood sugar before and after surgery? Improving blood sugar levels before and after surgery helps healing and can limit problems. A way of improving blood sugar control is eating a healthy diet by:  Eating less sugar and carbohydrates  Increasing activity/exercise  Talking with your doctor about reaching your blood sugar goals High blood sugars (greater than 180 mg/dL) can raise your risk of infections and slow your recovery, so you will need to focus on controlling your diabetes during the weeks before surgery. Make sure that the doctor who takes care of your diabetes knows about your planned surgery including the date and location.  How do I manage my blood sugar before surgery? Check your blood sugar at least 4 times a day, starting 2 days before surgery, to make sure that the level is not too high or low. Check your blood sugar the morning of your surgery when you wake up and every 2 hours until you get to the Short Stay unit. If your blood sugar is less than 70 mg/dL, you will need to treat for low blood sugar: Do not take insulin. Treat a low blood sugar (less than 70 mg/dL) with  cup of clear juice (cranberry or apple), 4 glucose tablets, OR glucose gel. Recheck blood sugar in 15 minutes after treatment  (to make sure it is greater than 70 mg/dL). If your blood sugar is not greater than 70 mg/dL on recheck, call [phone] for further instructions. Report your blood sugar to the short stay nurse when you get to Short Stay.  If you are admitted to the hospital after surgery: Your blood sugar will be checked by the staff and you will probably be given insulin after surgery (instead of oral diabetes medicines) to make sure you have good blood sugar levels. The goal for blood sugar control after surgery is 80-180 mg/dL.   WHAT DO I DO ABOUT MY DIABETES MEDICATION?  Do not take oral diabetes medicines (pills) the morning of surgery.  THE DAY BEFORE SURGERY, take Metformin as prescribed.      THE MORNING OF SURGERY, do not take Metformin.  Reviewed and Endorsed by [facility] Patient Education Committee, August 2015                              You may not have any metal on your body including jewelry, and body piercing             Do not wear lotions, powders, cologne, or deodorant              Men may shave face and neck.   Do not bring valuables to the hospital. [facility] IS NOT             RESPONSIBLE   FOR VALUABLES.   Contacts, glasses, dentures or bridgework may not be worn into surgery.   Bring small overnight bag day of surgery.   DO NOT BRING YOUR HOME MEDICATIONS TO THE HOSPITAL. PHARMACY WILL DISPENSE MEDICATIONS LISTED ON YOUR MEDICATION LIST TO YOU DURING YOUR ADMISSION IN THE HOSPITAL!  Please read over the following fact sheets you were given: IF YOU HAVE QUESTIONS ABOUT YOUR PRE-OP INSTRUCTIONS PLEASE CALL [phone] Contras    If you received a COVID test during your pre-op visit  it is requested that you wear a mask when out in public, stay away from anyone that may not be feeling well and notify your surgeon if you develop symptoms. If you test positive for Covid or have been in contact with anyone that has tested positive in the last 10 days  please notify you surgeon.      Pre-operative 5 CHG Bath Instructions   You can play a key role in reducing the risk of infection after surgery. Your skin needs to be as free of germs as possible. You can reduce the number of germs on your skin by washing with CHG (chlorhexidine gluconate) soap before surgery. CHG is an antiseptic soap that kills germs and continues to kill germs even after washing.   DO NOT use if you have an allergy to chlorhexidine/CHG or antibacterial soaps. If your skin becomes reddened or irritated, stop using the CHG and notify one of our RNs at [phone].   Please shower with the CHG soap starting 4 days before surgery using the following schedule:     Please keep in mind the following:  DO NOT shave, including legs and underarms, starting the day of your first shower.   You may shave your face at any point before/day of surgery.  Place clean sheets on your bed the day you start using CHG soap. Use a clean washcloth (not used since being washed) for each shower. DO NOT sleep with pets once you start using the CHG.   CHG Shower Instructions:  If you choose to wash your hair and private area, wash first with your normal shampoo/soap.  After you use shampoo/soap, rinse your hair and body thoroughly to remove shampoo/soap residue.  Turn the water OFF and apply about 3 tablespoons (45 ml) of CHG soap to a CLEAN washcloth.  Apply CHG soap ONLY FROM YOUR NECK DOWN TO YOUR TOES (washing for 3-5 minutes)  DO NOT use CHG soap on face, private areas, open wounds, or sores.  Pay special attention to the area where your surgery is being performed.  If you are having back surgery, having someone wash your back for you may be helpful. Wait 2 minutes after CHG soap is applied, then you may rinse off the CHG soap.  Pat dry with a clean towel  Put on clean clothes/pajamas   If you choose to wear lotion, please use ONLY the CHG-compatible lotions on the back of this paper.      Additional instructions for the day of surgery: DO NOT APPLY any lotions, deodorants, cologne, or perfumes.   Put on clean/comfortable clothes.  Brush your teeth.  Ask your nurse before applying any prescription medications to the skin.      CHG Compatible Lotions   Aveeno Moisturizing lotion  Cetaphil Moisturizing Cream  Cetaphil Moisturizing Lotion  Clairol Herbal Essence Moisturizing Lotion, Dry Skin  Clairol Herbal Essence Moisturizing Lotion, Extra Dry Skin  Clairol Herbal Essence Moisturizing Lotion, Normal Skin  Curel Age Defying Therapeutic Moisturizing Lotion with Alpha Hydroxy  Curel Extreme Care Body Lotion  Curel Soothing Hands Moisturizing Hand Lotion  Curel Therapeutic Moisturizing Cream, Fragrance-Free  Curel Therapeutic Moisturizing Lotion, Fragrance-Free  Curel Therapeutic Moisturizing Lotion, Original Formula  Eucerin Daily Replenishing Lotion  Eucerin Dry Skin Therapy Plus Alpha Hydroxy Crme  Eucerin Dry Skin Therapy Plus Alpha Hydroxy Lotion  Eucerin Original Crme  Eucerin Original Lotion  Eucerin Plus Crme Eucerin Plus Lotion  Eucerin TriLipid Replenishing Lotion  Keri Anti-Bacterial Hand Lotion  Keri Deep Conditioning Original Lotion Dry Skin Formula Softly Scented  Keri Deep Conditioning Original Lotion, Fragrance Free Sensitive Skin Formula  Keri Lotion Fast Absorbing Fragrance Free Sensitive Skin Formula  Keri Lotion Fast Absorbing Softly Scented Dry Skin Formula  Keri Original Lotion  Keri Skin Renewal Lotion Keri Silky Smooth Lotion  Keri Silky Smooth Sensitive Skin Lotion  Nivea Body Creamy Conditioning Oil  Nivea Body Extra Enriched Teacher, adult education Moisturizing Lotion Nivea Crme  Nivea Skin Firming Lotion  NutraDerm 30 Skin Lotion  NutraDerm Skin Lotion  NutraDerm Therapeutic Skin Cream  NutraDerm Therapeutic Skin Lotion  ProShield Protective Hand Cream  Provon moisturizing lotion  WHAT IS A  BLOOD TRANSFUSION? Blood Transfusion Information  A transfusion is the replacement of blood or some of its parts. Blood is made up of multiple cells which provide different functions. Red blood cells carry oxygen and are used for blood loss replacement. White blood cells fight against infection. Platelets control bleeding. Plasma helps clot blood. Other blood products are available for specialized needs, such as hemophilia or other clotting disorders. BEFORE THE TRANSFUSION  Who gives blood for transfusions?  Healthy volunteers who are fully evaluated to make sure their blood is safe. This is blood bank blood. Transfusion therapy is the safest it has ever been in the practice of medicine. Before blood is taken from a donor, a complete history is taken to make sure that person has no history of diseases nor engages in risky social behavior (examples are intravenous drug use or sexual activity with multiple partners). The donor's travel history is screened to minimize risk of transmitting infections, such as malaria. The donated blood is tested for signs of infectious diseases, such as HIV and hepatitis. The blood is then tested to be sure it is compatible with you in order to minimize the chance of a transfusion reaction. If you or a relative donates blood, this is often done in anticipation of surgery and is not appropriate for emergency situations. It takes many days to process the donated blood. RISKS AND COMPLICATIONS Although transfusion therapy is very safe and saves many lives, the main dangers of transfusion include:  Getting an infectious disease. Developing a transfusion reaction. This is an allergic reaction to something in the blood you were given. Every precaution is taken to prevent this. The decision to have a blood transfusion has been considered carefully by your caregiver before blood is given. Blood is not given unless the benefits outweigh the risks. AFTER THE TRANSFUSION Right  after receiving a blood transfusion, you will usually feel much better and more energetic. This is especially true if your red blood cells have gotten low (anemic). The transfusion raises the level of the red blood cells which carry oxygen, and this usually causes an energy increase. The nurse administering the transfusion will monitor you carefully for complications. HOME CARE INSTRUCTIONS  No special instructions are needed after a transfusion. You may find your energy is better. Speak with your caregiver about any limitations on activity for underlying diseases you may have. SEEK MEDICAL CARE IF:  Your condition is not improving after your transfusion. You develop redness or irritation at the intravenous (IV) site. SEEK IMMEDIATE MEDICAL CARE IF:  Any of the following symptoms occur  over the next 12 hours: Shaking chills. You have a temperature by mouth above 102 F (38.9 C), not controlled by medicine. Chest, back, or muscle pain. People around you feel you are not acting correctly or are confused. Shortness of breath or difficulty breathing. Dizziness and fainting. You get a rash or develop hives. You have a decrease in urine output. Your urine turns a dark color or changes to pink, red, or brown. Any of the following symptoms occur over the next 10 days: You have a temperature by mouth above 102 F (38.9 C), not controlled by medicine. Shortness of breath. Weakness after normal activity. The white part of the eye turns yellow (jaundice). You have a decrease in the amount of urine or are urinating less often. Your urine turns a dark color or changes to pink, red, or brown. Document Released: 08/11/2000 Document Revised: 11/06/2011 Document Reviewed: 03/30/2008 [facility] Patient Information 2014 [place], [place].  _______________________________________________________________________

## 2023-03-22 NOTE — Progress Notes (Addendum)
COVID Vaccine Completed: yes  Date of COVID positive in last 90 days: no  PCP - Mercy Moore, MD Cardiologist - n/a  Clearance by Dr. Basilia Jumbo 01/28/23 on chart (states pt will not consent to a spinal block)  Chest x-ray - CT 02/09/23 [facility] EKG - 03/26/23 Epic/chart Stress Test - few years ago per pt ECHO - 03/14/23 Epic Cardiac Cath - n/a Pacemaker/ICD device last checked: n/a Spinal Cord Stimulator: n/a  Bowel Prep - no  Sleep Study - n/a CPAP -   Fasting Blood Sugar - pre MD Checks Blood Sugar no checks at home  Last dose of GLP1 agonist-  N/A GLP1 instructions:  N/A   Last dose of SGLT-2 inhibitors-  N/A SGLT-2 instructions: N/A   Blood Thinner Instructions:  n/a Aspirin Instructions: Last Dose:  Activity level: Can go up a flight of stairs and perform activities of daily living without stopping and without symptoms of chest pain or shortness of breath. Slow with stairs, ambulates with cane   Anesthesia review:   Patient denies shortness of breath, fever, cough and chest pain at PAT appointment  Patient verbalized understanding of instructions that were given to them at the PAT appointment. Patient was also instructed that they will need to review over the PAT instructions again at home before surgery.

## 2023-03-22 NOTE — Progress Notes (Signed)
Please place orders for PAT appointment scheduled 03/26/23.

## 2023-03-22 NOTE — Progress Notes (Signed)
Second request for pre op orders in [facility]: Spoke with Kerri at [facility].

## 2023-03-26 ENCOUNTER — Encounter (HOSPITAL_COMMUNITY)
Admission: RE | Admit: 2023-03-26 | Discharge: 2023-03-26 | Disposition: A | Discharge status: Home / Self Care | Payer: Medicare Other | Source: Ambulatory Visit | Attending: Orthopedic Surgery | Admitting: Orthopedic Surgery

## 2023-03-26 ENCOUNTER — Encounter (HOSPITAL_COMMUNITY): Payer: Self-pay

## 2023-03-26 ENCOUNTER — Other Ambulatory Visit: Payer: Self-pay

## 2023-03-26 VITALS — BP 135/77 | HR 60 | Temp 98.3°F | Resp 14 | Ht 73.0 in | Wt 297.0 lb

## 2023-03-26 DIAGNOSIS — Z01818 Encounter for other preprocedural examination: Secondary | ICD-10-CM | POA: Insufficient documentation

## 2023-03-26 DIAGNOSIS — I10 Essential (primary) hypertension: Secondary | ICD-10-CM | POA: Diagnosis not present

## 2023-03-26 DIAGNOSIS — E119 Type 2 diabetes mellitus without complications: Secondary | ICD-10-CM | POA: Diagnosis not present

## 2023-03-26 HISTORY — DX: Malignant (primary) neoplasm, unspecified: C80.1

## 2023-03-26 HISTORY — DX: Anxiety disorder, unspecified: F41.9

## 2023-03-26 HISTORY — DX: Prediabetes: R73.03

## 2023-03-26 HISTORY — DX: Pneumonia, unspecified organism: J18.9

## 2023-03-26 LAB — CBC
HCT: 36.4 % — ABNORMAL LOW (ref 39.0–52.0)
Hemoglobin: 12.6 g/dL — ABNORMAL LOW (ref 13.0–17.0)
MCH: 32.5 pg (ref 26.0–34.0)
MCHC: 34.6 g/dL (ref 30.0–36.0)
MCV: 93.8 fL (ref 80.0–100.0)
Platelets: 153 10*3/uL (ref 150–400)
RBC: 3.88 MIL/uL — ABNORMAL LOW (ref 4.22–5.81)
RDW: 12.4 % (ref 11.5–15.5)
WBC: 5 10*3/uL (ref 4.0–10.5)
nRBC: 0 % (ref 0.0–0.2)

## 2023-03-26 LAB — TYPE AND SCREEN
ABO/RH(D): O POS
Antibody Screen: NEGATIVE

## 2023-03-26 LAB — SURGICAL PCR SCREEN
MRSA, PCR: NEGATIVE
Staphylococcus aureus: NEGATIVE

## 2023-03-26 LAB — BASIC METABOLIC PANEL
Anion gap: 7 (ref 5–15)
BUN: 24 mg/dL — ABNORMAL HIGH (ref 8–23)
CO2: 30 mmol/L (ref 22–32)
Calcium: 9.3 mg/dL (ref 8.9–10.3)
Chloride: 101 mmol/L (ref 98–111)
Creatinine, Ser: 1.18 mg/dL (ref 0.61–1.24)
GFR, Estimated: >60 mL/min (ref >60)
Glucose, Bld: 110 mg/dL — ABNORMAL HIGH (ref 70–99)
Potassium: 4.6 mmol/L (ref 3.5–5.1)
Sodium: 138 mmol/L (ref 135–145)

## 2023-03-31 ENCOUNTER — Ambulatory Visit: Payer: Self-pay | Admitting: Student

## 2023-03-31 NOTE — H&P (Signed)
TOTAL HIP ADMISSION H&P  Patient is admitted for left total hip arthroplasty.  Subjective:  Chief Complaint: left hip pain  HPI: Alfred Blair, 72 y.o. male, has a history of pain and functional disability in the left hip(s) due to arthritis and patient has failed non-surgical conservative treatments for greater than 12 weeks to include NSAID's and/or analgesics, corticosteriod injections, flexibility and strengthening excercises, use of assistive devices, and activity modification.  Onset of symptoms was gradual starting 10 years ago with rapidlly worsening course since that time.The patient noted no past surgery on the left hip(s).  Patient currently rates pain in the left hip at 10 out of 10 with activity. Patient has night pain, worsening of pain with activity and weight bearing, trendelenberg gait, pain that interfers with activities of daily living, and pain with passive range of motion. Patient has evidence of subchondral cysts, subchondral sclerosis, periarticular osteophytes, and joint space narrowing by imaging studies. This condition presents safety issues increasing the risk of falls.  There is no current active infection.  There are no problems to display for this patient.  Past Medical History:  Diagnosis Date   Anxiety    Asthma    Cancer (HCC)    colon   GERD (gastroesophageal reflux disease)    Hypertension    Kidney stone    Pneumonia    Pre-diabetes    Shortness of breath     Past Surgical History:  Procedure Laterality Date   CYSTOSCOPY     ESOPHAGOGASTRODUODENOSCOPY (EGD) WITH ESOPHAGEAL DILATION     ESOPHAGOGASTRODUODENOSCOPY (EGD) WITH ESOPHAGEAL DILATION N/A 03/10/2013   Procedure: ESOPHAGOGASTRODUODENOSCOPY (EGD) WITH ESOPHAGEAL DILATION;  Surgeon: Florencia Reasons, MD;  Location: [facility] ENDOSCOPY;  Service: Endoscopy;  Laterality: N/A;  possible through-the-scope esophageal dilatation, and/or removal of foreign body   MRI      Current Outpatient Medications   Medication Sig Dispense Refill Last Dose   escitalopram (LEXAPRO) 10 MG tablet Take 10 mg by mouth daily.      gabapentin (NEURONTIN) 300 MG capsule Take 300 mg by mouth 4 (four) times daily.      hydrochlorothiazide (HYDRODIURIL) 25 MG tablet Take 25 mg by mouth daily.      losartan (COZAAR) 50 MG tablet Take 50 mg by mouth daily.      meloxicam (MOBIC) 15 MG tablet Take 15 mg by mouth daily.      metFORMIN (GLUCOPHAGE-XR) 500 MG 24 hr tablet Take 500 mg by mouth daily with breakfast.      Multiple Vitamins-Minerals (ADULT GUMMY PO) Take 1 tablet by mouth daily.      traMADol (ULTRAM) 50 MG tablet Take 100 mg by mouth every 6 (six) hours as needed for moderate pain.      No current facility-administered medications for this visit.   Allergies  Allergen Reactions   Fish Allergy Anaphylaxis    Social History   Tobacco Use   Smoking status: Never   Smokeless tobacco: Never  Substance Use Topics   Alcohol use: Yes    Comment: very little    No family history on file.   Review of Systems  Musculoskeletal:  Positive for arthralgias and gait problem.  All other systems reviewed and are negative.   Objective:  Physical Exam Constitutional:      Appearance: Normal appearance.  HENT:     Head: Normocephalic and atraumatic.     Nose: Nose normal.     Mouth/Throat:     Mouth: Mucous membranes are  moist.     Pharynx: Oropharynx is clear.  Eyes:     Conjunctiva/sclera: Conjunctivae normal.  Cardiovascular:     Rate and Rhythm: Normal rate and regular rhythm.     Pulses: Normal pulses.     Heart sounds: Normal heart sounds.  Pulmonary:     Effort: Pulmonary effort is normal.     Breath sounds: Normal breath sounds.  Abdominal:     General: Abdomen is flat.     Palpations: Abdomen is soft.  Genitourinary:    Comments: deferred Musculoskeletal:     Cervical back: Normal range of motion and neck supple.     Comments: Examination of the left hip reveals no skin wounds or  lesions. Mild trochanteric tenderness to palpation. The patient has severely restricted range of motion of the left hip. He does have a 60 degree flexion contracture, and I can further flex him up to about 80 degrees. He internally rotates 0 degrees, and externally rotates 5 degrees. He has very significant clicking/ratcheting with range of motion of the hip. Positive Stinchfield.  Distally, there is no focal motor or sensory deficit. He has palpable pedal pulses.  He does have very significant bilateral lower extremity pitting edema. He has venous stasis skin changes to bilateral lower extremities.  He ambulates with a severely antalgic gait using a cane.  Skin:    General: Skin is warm and dry.     Capillary Refill: Capillary refill takes less than 2 seconds.  Neurological:     General: No focal deficit present.     Mental Status: He is alert and oriented to person, place, and time.  Psychiatric:        Mood and Affect: Mood normal.        Behavior: Behavior normal.        Thought Content: Thought content normal.        Judgment: Judgment normal.     Vital signs in last 24 hours: @VSRANGES @  Labs:   Estimated body mass index is 39.18 kg/m as calculated from the following:   Height as of 03/26/23: 6\' 1"  (1.854 m).   Weight as of 03/26/23: 134.7 kg.   Imaging Review Plain radiographs demonstrate severe degenerative joint disease of the left hip(s). The bone quality appears to be adequate for age and reported activity level.      Assessment/Plan:  End stage arthritis, left hip(s)  The patient history, physical examination, clinical judgement of the provider and imaging studies are consistent with end stage degenerative joint disease of the left hip(s) and total hip arthroplasty is deemed medically necessary. The treatment options including medical management, injection therapy, arthroscopy and arthroplasty were discussed at length. The risks and benefits of total hip  arthroplasty were presented and reviewed. The risks due to aseptic loosening, infection, stiffness, dislocation/subluxation,  thromboembolic complications and other imponderables were discussed.  The patient acknowledged the explanation, agreed to proceed with the plan and consent was signed. Patient is being admitted for inpatient treatment for surgery, pain control, PT, OT, prophylactic antibiotics, VTE prophylaxis, progressive ambulation and ADL's and discharge planning.The patient is planning to be discharged home with HEP after an overnight stay.   Therapy Plans: HEP.  Disposition: Home with wife Planned DVT Prophylaxis: Eliquis 2.5mg  BID DME needed: Has rolling walker.  PCP: Cleared.  TXA: IV Allergies: NDKA.  Anesthesia Concerns: Nervous about having anesthesia. Discussed about spinal and addressed concerns.  BMI: 40.9 Last HgbA1c: 5.1 Other: - T2DM, metformin.  - CAD  mild.  - Asthma history, doesn't use anymore.  - Venous insufficiency.  - Chronic LE edema. prescription for compression stockings provided last visit. He did not get the prescription. Printed  - Gabapentin and tramadol.  - Hydrocodone, zofran, has meloxicam.  - 03/26/23: Hgb 12.6, K+ 4.6, Cr. 1.18.     Patient's anticipated LOS is less than 2 midnights, meeting these requirements: - Younger than 58 - Lives within 1 hour of care - Has a competent adult at home to recover with post-op recover - NO history of  - Chronic pain requiring opiods  - Diabetes  - Coronary Artery Disease  - Heart failure  - Heart attack  - Stroke  - DVT/VTE  - Cardiac arrhythmia  - Respiratory Failure/COPD  - Renal failure  - Anemia  - Advanced Liver disease

## 2023-03-31 NOTE — H&P (View-Only) (Signed)
TOTAL HIP ADMISSION H&P  Patient is admitted for left total hip arthroplasty.  Subjective:  Chief Complaint: left hip pain  HPI: Alfred Blair, 72 y.o. male, has a history of pain and functional disability in the left hip(s) due to arthritis and patient has failed non-surgical conservative treatments for greater than 12 weeks to include NSAID's and/or analgesics, corticosteriod injections, flexibility and strengthening excercises, use of assistive devices, and activity modification.  Onset of symptoms was gradual starting 10 years ago with rapidlly worsening course since that time.The patient noted no past surgery on the left hip(s).  Patient currently rates pain in the left hip at 10 out of 10 with activity. Patient has night pain, worsening of pain with activity and weight bearing, trendelenberg gait, pain that interfers with activities of daily living, and pain with passive range of motion. Patient has evidence of subchondral cysts, subchondral sclerosis, periarticular osteophytes, and joint space narrowing by imaging studies. This condition presents safety issues increasing the risk of falls.  There is no current active infection.  There are no problems to display for this patient.  Past Medical History:  Diagnosis Date   Anxiety    Asthma    Cancer (HCC)    colon   GERD (gastroesophageal reflux disease)    Hypertension    Kidney stone    Pneumonia    Pre-diabetes    Shortness of breath     Past Surgical History:  Procedure Laterality Date   CYSTOSCOPY     ESOPHAGOGASTRODUODENOSCOPY (EGD) WITH ESOPHAGEAL DILATION     ESOPHAGOGASTRODUODENOSCOPY (EGD) WITH ESOPHAGEAL DILATION N/A 03/10/2013   Procedure: ESOPHAGOGASTRODUODENOSCOPY (EGD) WITH ESOPHAGEAL DILATION;  Surgeon: Florencia Reasons, MD;  Location: [facility];  Service: Endoscopy;  Laterality: N/A;  possible through-the-scope esophageal dilatation, and/or removal of foreign body   MRI      Current Outpatient Medications   Medication Sig Dispense Refill Last Dose   escitalopram (LEXAPRO) 10 MG tablet Take 10 mg by mouth daily.      gabapentin (NEURONTIN) 300 MG capsule Take 300 mg by mouth 4 (four) times daily.      hydrochlorothiazide (HYDRODIURIL) 25 MG tablet Take 25 mg by mouth daily.      losartan (COZAAR) 50 MG tablet Take 50 mg by mouth daily.      meloxicam (MOBIC) 15 MG tablet Take 15 mg by mouth daily.      metFORMIN (GLUCOPHAGE-XR) 500 MG 24 hr tablet Take 500 mg by mouth daily with breakfast.      Multiple Vitamins-Minerals (ADULT GUMMY PO) Take 1 tablet by mouth daily.      traMADol (ULTRAM) 50 MG tablet Take 100 mg by mouth every 6 (six) hours as needed for moderate pain.      No current facility-administered medications for this visit.   Allergies  Allergen Reactions   Fish Allergy Anaphylaxis    Social History   Tobacco Use   Smoking status: Never   Smokeless tobacco: Never  Substance Use Topics   Alcohol use: Yes    Comment: very little    No family history on file.   Review of Systems  Musculoskeletal:  Positive for arthralgias and gait problem.  All other systems reviewed and are negative.   Objective:  Physical Exam Constitutional:      Appearance: Normal appearance.  HENT:     Head: Normocephalic and atraumatic.     Nose: Nose normal.     Mouth/Throat:     Mouth: Mucous membranes are  moist.     Pharynx: Oropharynx is clear.  Eyes:     Conjunctiva/sclera: Conjunctivae normal.  Cardiovascular:     Rate and Rhythm: Normal rate and regular rhythm.     Pulses: Normal pulses.     Heart sounds: Normal heart sounds.  Pulmonary:     Effort: Pulmonary effort is normal.     Breath sounds: Normal breath sounds.  Abdominal:     General: Abdomen is flat.     Palpations: Abdomen is soft.  Genitourinary:    Comments: deferred Musculoskeletal:     Cervical back: Normal range of motion and neck supple.     Comments: Examination of the left hip reveals no skin wounds or  lesions. Mild trochanteric tenderness to palpation. The patient has severely restricted range of motion of the left hip. He does have a 60 degree flexion contracture, and I can further flex him up to about 80 degrees. He internally rotates 0 degrees, and externally rotates 5 degrees. He has very significant clicking/ratcheting with range of motion of the hip. Positive Stinchfield.  Distally, there is no focal motor or sensory deficit. He has palpable pedal pulses.  He does have very significant bilateral lower extremity pitting edema. He has venous stasis skin changes to bilateral lower extremities.  He ambulates with a severely antalgic gait using a cane.  Skin:    General: Skin is warm and dry.     Capillary Refill: Capillary refill takes less than 2 seconds.  Neurological:     General: No focal deficit present.     Mental Status: He is alert and oriented to person, place, and time.  Psychiatric:        Mood and Affect: Mood normal.        Behavior: Behavior normal.        Thought Content: Thought content normal.        Judgment: Judgment normal.     Vital signs in last 24 hours: @VSRANGES @  Labs:   Estimated body mass index is 39.18 kg/m as calculated from the following:   Height as of 03/26/23: 6\' 1"  (1.854 m).   Weight as of 03/26/23: 134.7 kg.   Imaging Review Plain radiographs demonstrate severe degenerative joint disease of the left hip(s). The bone quality appears to be adequate for age and reported activity level.      Assessment/Plan:  End stage arthritis, left hip(s)  The patient history, physical examination, clinical judgement of the provider and imaging studies are consistent with end stage degenerative joint disease of the left hip(s) and total hip arthroplasty is deemed medically necessary. The treatment options including medical management, injection therapy, arthroscopy and arthroplasty were discussed at length. The risks and benefits of total hip  arthroplasty were presented and reviewed. The risks due to aseptic loosening, infection, stiffness, dislocation/subluxation,  thromboembolic complications and other imponderables were discussed.  The patient acknowledged the explanation, agreed to proceed with the plan and consent was signed. Patient is being admitted for inpatient treatment for surgery, pain control, PT, OT, prophylactic antibiotics, VTE prophylaxis, progressive ambulation and ADL's and discharge planning.The patient is planning to be discharged home with HEP after an overnight stay.   Therapy Plans: HEP.  Disposition: Home with wife Planned DVT Prophylaxis: Eliquis 2.5mg  BID DME needed: Has rolling walker.  PCP: Cleared.  TXA: IV Allergies: NDKA.  Anesthesia Concerns: Nervous about having anesthesia. Discussed about spinal and addressed concerns.  BMI: 40.9 Last HgbA1c: 5.1 Other: - T2DM, metformin.  - CAD  mild.  - Asthma history, doesn't use anymore.  - Venous insufficiency.  - Chronic LE edema. prescription for compression stockings provided last visit. He did not get the prescription. Printed  - Gabapentin and tramadol.  - Hydrocodone, zofran, has meloxicam.  - 03/26/23: Hgb 12.6, K+ 4.6, Cr. 1.18.     Patient's anticipated LOS is less than 2 midnights, meeting these requirements: - Younger than 58 - Lives within 1 hour of care - Has a competent adult at home to recover with post-op recover - NO history of  - Chronic pain requiring opiods  - Diabetes  - Coronary Artery Disease  - Heart failure  - Heart attack  - Stroke  - DVT/VTE  - Cardiac arrhythmia  - Respiratory Failure/COPD  - Renal failure  - Anemia  - Advanced Liver disease

## 2023-04-04 NOTE — Anesthesia Preprocedure Evaluation (Signed)
Anesthesia Evaluation  Patient identified by MRN, date of birth, ID band Patient awake    Reviewed: Allergy & Precautions, NPO status , Patient's Chart, lab work & pertinent test results  History of Anesthesia Complications Negative for: history of anesthetic complications  Airway Mallampati: III  TM Distance: >3 FB Neck ROM: Full    Dental   Pulmonary asthma    Pulmonary exam normal        Cardiovascular hypertension, Pt. on medications Normal cardiovascular exam     Neuro/Psych   Anxiety        GI/Hepatic Neg liver ROS,GERD  Controlled,,  Endo/Other  Oral Hypoglycemic Agents    Renal/GU negative Renal ROS     Musculoskeletal  (+) Arthritis ,    Abdominal   Peds  Hematology negative hematology ROS (+)   Anesthesia Other Findings Day of surgery medications reviewed with patient.  Reproductive/Obstetrics                             Anesthesia Physical Anesthesia Plan  ASA: 2  Anesthesia Plan: Spinal   Post-op Pain Management:  Regional for Post-op pain and Tylenol PO (pre-op)*   Induction:   PONV Risk Score and Plan: 2 and Treatment may vary due to age or medical condition, Ondansetron, Propofol infusion, Dexamethasone and Midazolam  Airway Management Planned: Natural Airway and Simple Face Mask  Additional Equipment: None  Intra-op Plan:   Post-operative Plan:   Informed Consent: I have reviewed the patients History and Physical, chart, labs and discussed the procedure including the risks, benefits and alternatives for the proposed anesthesia with the patient or authorized representative who has indicated his/her understanding and acceptance.       Plan Discussed with: CRNA  Anesthesia Plan Comments:        Anesthesia Quick Evaluation

## 2023-04-05 ENCOUNTER — Other Ambulatory Visit: Payer: Self-pay

## 2023-04-05 ENCOUNTER — Encounter (HOSPITAL_COMMUNITY): Payer: Self-pay | Admitting: Orthopedic Surgery

## 2023-04-05 ENCOUNTER — Ambulatory Visit (HOSPITAL_COMMUNITY)
Admission: RE | Admit: 2023-04-05 | Discharge: 2023-04-06 | Disposition: A | Discharge status: Home / Self Care | Payer: Medicare Other | Source: Ambulatory Visit | Attending: Orthopedic Surgery | Admitting: Orthopedic Surgery

## 2023-04-05 ENCOUNTER — Ambulatory Visit (HOSPITAL_COMMUNITY): Payer: Medicare Other

## 2023-04-05 ENCOUNTER — Ambulatory Visit (HOSPITAL_BASED_OUTPATIENT_CLINIC_OR_DEPARTMENT_OTHER): Payer: Medicare Other | Admitting: Anesthesiology

## 2023-04-05 ENCOUNTER — Encounter (HOSPITAL_COMMUNITY)
Admission: RE | Disposition: A | Discharge status: Home / Self Care | Payer: Self-pay | Source: Ambulatory Visit | Attending: Orthopedic Surgery

## 2023-04-05 ENCOUNTER — Ambulatory Visit (HOSPITAL_COMMUNITY): Payer: Medicare Other | Admitting: Anesthesiology

## 2023-04-05 DIAGNOSIS — I10 Essential (primary) hypertension: Secondary | ICD-10-CM | POA: Insufficient documentation

## 2023-04-05 DIAGNOSIS — J45909 Unspecified asthma, uncomplicated: Secondary | ICD-10-CM | POA: Insufficient documentation

## 2023-04-05 DIAGNOSIS — M1612 Unilateral primary osteoarthritis, left hip: Secondary | ICD-10-CM | POA: Insufficient documentation

## 2023-04-05 DIAGNOSIS — F419 Anxiety disorder, unspecified: Secondary | ICD-10-CM | POA: Diagnosis not present

## 2023-04-05 HISTORY — PX: TOTAL HIP ARTHROPLASTY: SHX124

## 2023-04-05 LAB — GLUCOSE, CAPILLARY
Glucose-Capillary: 153 mg/dL — ABNORMAL HIGH (ref 70–99)
Glucose-Capillary: 133 mg/dL — ABNORMAL HIGH (ref 70–99)

## 2023-04-05 SURGERY — ARTHROPLASTY, HIP, TOTAL, ANTERIOR APPROACH
Anesthesia: Spinal | Site: Hip | Laterality: Left

## 2023-04-05 MED ORDER — KETOROLAC TROMETHAMINE 30 MG/ML IJ SOLN
INTRAMUSCULAR | Status: DC | PRN
  Administered 2023-04-05: 30 mg

## 2023-04-05 MED ORDER — SODIUM CHLORIDE (PF) 0.9 % IJ SOLN
INTRAMUSCULAR | Status: AC
  Filled 2023-04-05: qty 50

## 2023-04-05 MED ORDER — METHOCARBAMOL 500 MG IVPB - SIMPLE MED: 500.0000 mg | Freq: Four times a day (QID) | INTRAVENOUS | Status: DC | PRN

## 2023-04-05 MED ORDER — MORPHINE SULFATE (PF) 2 MG/ML IV SOLN: 0.5000 mg | INTRAVENOUS | Status: DC | PRN

## 2023-04-05 MED ORDER — METOCLOPRAMIDE HCL 5 MG PO TABS: 5.0000 mg | ORAL_TABLET | Freq: Three times a day (TID) | ORAL | Status: DC | PRN

## 2023-04-05 MED ORDER — SENNA 8.6 MG PO TABS
1.0000 | ORAL_TABLET | Freq: Two times a day (BID) | ORAL | Status: DC
  Administered 2023-04-05 – 2023-04-06 (×2): 8.6 mg via ORAL
  Filled 2023-04-05 (×2): qty 1

## 2023-04-05 MED ORDER — ASPIRIN 81 MG PO CHEW
81.0000 mg | CHEWABLE_TABLET | Freq: Two times a day (BID) | ORAL | Status: DC
  Administered 2023-04-05 – 2023-04-06 (×2): 81 mg via ORAL
  Filled 2023-04-05 (×2): qty 1

## 2023-04-05 MED ORDER — WATER FOR IRRIGATION, STERILE IR SOLN
Status: DC | PRN
  Administered 2023-04-05: 2000 mL

## 2023-04-05 MED ORDER — LACTATED RINGERS IV SOLN: INTRAVENOUS | Status: DC

## 2023-04-05 MED ORDER — BUPIVACAINE-EPINEPHRINE 0.25% -1:200000 IJ SOLN
INTRAMUSCULAR | Status: DC | PRN
  Administered 2023-04-05: 30 mL

## 2023-04-05 MED ORDER — CEFAZOLIN IN SODIUM CHLORIDE 3-0.9 GM/100ML-% IV SOLN
3.0000 g | INTRAVENOUS | Status: AC
  Administered 2023-04-05: 3 g via INTRAVENOUS
  Filled 2023-04-05: qty 100

## 2023-04-05 MED ORDER — EPHEDRINE SULFATE (PRESSORS) 50 MG/ML IJ SOLN
INTRAMUSCULAR | Status: DC | PRN
Start: 2023-04-05 — End: 2023-04-05
  Administered 2023-04-05: 10 mg via INTRAVENOUS

## 2023-04-05 MED ORDER — INSULIN ASPART 100 UNIT/ML IJ SOLN: 0.0000 [IU] | Freq: Every day | INTRAMUSCULAR | Status: DC

## 2023-04-05 MED ORDER — SODIUM CHLORIDE (PF) 0.9 % IJ SOLN
INTRAMUSCULAR | Status: DC | PRN
  Administered 2023-04-05: 30 mL

## 2023-04-05 MED ORDER — ALBUMIN HUMAN 5 % IV SOLN
INTRAVENOUS | Status: AC
  Filled 2023-04-05: qty 250

## 2023-04-05 MED ORDER — ACETAMINOPHEN 500 MG PO TABS
1000.0000 mg | ORAL_TABLET | Freq: Once | ORAL | Status: DC
  Filled 2023-04-05: qty 2

## 2023-04-05 MED ORDER — DEXAMETHASONE SODIUM PHOSPHATE 10 MG/ML IJ SOLN
INTRAMUSCULAR | Status: AC
  Filled 2023-04-05: qty 1

## 2023-04-05 MED ORDER — POVIDONE-IODINE 10 % EX SWAB: 2.0000 | Freq: Once | CUTANEOUS | Status: DC

## 2023-04-05 MED ORDER — ONDANSETRON HCL 4 MG/2ML IJ SOLN
INTRAMUSCULAR | Status: AC
  Filled 2023-04-05: qty 2

## 2023-04-05 MED ORDER — ACETAMINOPHEN 500 MG PO TABS
1000.0000 mg | ORAL_TABLET | Freq: Once | ORAL | Status: AC
  Administered 2023-04-05: 1000 mg via ORAL

## 2023-04-05 MED ORDER — PROPOFOL 500 MG/50ML IV EMUL
INTRAVENOUS | Status: AC
  Filled 2023-04-05: qty 50

## 2023-04-05 MED ORDER — POLYETHYLENE GLYCOL 3350 17 G PO PACK: 17.0000 g | PACK | Freq: Every day | ORAL | Status: DC | PRN

## 2023-04-05 MED ORDER — METOCLOPRAMIDE HCL 5 MG/ML IJ SOLN: 5.0000 mg | Freq: Three times a day (TID) | INTRAMUSCULAR | Status: DC | PRN

## 2023-04-05 MED ORDER — ONDANSETRON HCL 4 MG/2ML IJ SOLN
INTRAMUSCULAR | Status: DC | PRN
  Administered 2023-04-05: 4 mg via INTRAVENOUS

## 2023-04-05 MED ORDER — MIDAZOLAM HCL 5 MG/5ML IJ SOLN
INTRAMUSCULAR | Status: DC | PRN
  Administered 2023-04-05: 2 mg via INTRAVENOUS

## 2023-04-05 MED ORDER — BUPIVACAINE-EPINEPHRINE 0.25% -1:200000 IJ SOLN
INTRAMUSCULAR | Status: AC
  Filled 2023-04-05: qty 1

## 2023-04-05 MED ORDER — ACETAMINOPHEN 325 MG PO TABS: 325.0000 mg | ORAL_TABLET | Freq: Four times a day (QID) | ORAL | Status: DC | PRN

## 2023-04-05 MED ORDER — GABAPENTIN 300 MG PO CAPS
300.0000 mg | ORAL_CAPSULE | Freq: Four times a day (QID) | ORAL | Status: DC
  Administered 2023-04-05 – 2023-04-06 (×5): 300 mg via ORAL
  Filled 2023-04-05 (×5): qty 1

## 2023-04-05 MED ORDER — PHENOL 1.4 % MT LIQD: 1.0000 | OROMUCOSAL | Status: DC | PRN

## 2023-04-05 MED ORDER — ALBUMIN HUMAN 5 % IV SOLN: INTRAVENOUS | Status: DC | PRN

## 2023-04-05 MED ORDER — ALUM & MAG HYDROXIDE-SIMETH 200-200-20 MG/5ML PO SUSP: 30.0000 mL | ORAL | Status: DC | PRN

## 2023-04-05 MED ORDER — ONDANSETRON HCL 4 MG PO TABS: 4.0000 mg | ORAL_TABLET | Freq: Four times a day (QID) | ORAL | Status: DC | PRN

## 2023-04-05 MED ORDER — KETOROLAC TROMETHAMINE 30 MG/ML IJ SOLN
INTRAMUSCULAR | Status: AC
  Filled 2023-04-05: qty 1

## 2023-04-05 MED ORDER — PROPOFOL 1000 MG/100ML IV EMUL
INTRAVENOUS | Status: AC
  Filled 2023-04-05: qty 100

## 2023-04-05 MED ORDER — CEFAZOLIN SODIUM-DEXTROSE 2-4 GM/100ML-% IV SOLN
2.0000 g | Freq: Four times a day (QID) | INTRAVENOUS | Status: AC
  Administered 2023-04-05 (×2): 2 g via INTRAVENOUS
  Filled 2023-04-05 (×2): qty 100

## 2023-04-05 MED ORDER — PHENYLEPHRINE HCL-NACL 20-0.9 MG/250ML-% IV SOLN
INTRAVENOUS | Status: DC | PRN
Start: 2023-04-05 — End: 2023-04-05
  Administered 2023-04-05: 30 ug/min via INTRAVENOUS

## 2023-04-05 MED ORDER — PROPOFOL 500 MG/50ML IV EMUL
INTRAVENOUS | Status: DC | PRN
  Administered 2023-04-05: 60 mg via INTRAVENOUS

## 2023-04-05 MED ORDER — OXYCODONE HCL 5 MG PO TABS: 5.0000 mg | ORAL_TABLET | Freq: Once | ORAL | Status: DC | PRN

## 2023-04-05 MED ORDER — CHLORHEXIDINE GLUCONATE 0.12 % MT SOLN
15.0000 mL | Freq: Once | OROMUCOSAL | Status: AC
  Administered 2023-04-05: 15 mL via OROMUCOSAL

## 2023-04-05 MED ORDER — AMISULPRIDE (ANTIEMETIC) 5 MG/2ML IV SOLN: 10.0000 mg | Freq: Once | INTRAVENOUS | Status: DC | PRN

## 2023-04-05 MED ORDER — ESCITALOPRAM OXALATE 10 MG PO TABS
10.0000 mg | ORAL_TABLET | Freq: Every day | ORAL | Status: DC
  Administered 2023-04-06: 10 mg via ORAL
  Filled 2023-04-05: qty 1

## 2023-04-05 MED ORDER — INSULIN ASPART 100 UNIT/ML IJ SOLN
0.0000 [IU] | Freq: Three times a day (TID) | INTRAMUSCULAR | Status: DC
  Administered 2023-04-05: 2 [IU] via SUBCUTANEOUS

## 2023-04-05 MED ORDER — GLYCOPYRROLATE 0.2 MG/ML IJ SOLN
INTRAMUSCULAR | Status: DC | PRN
  Administered 2023-04-05: .2 mg via INTRAVENOUS

## 2023-04-05 MED ORDER — ISOPROPYL ALCOHOL 70 % SOLN
Status: DC | PRN
  Administered 2023-04-05: 1 via TOPICAL

## 2023-04-05 MED ORDER — ORAL CARE MOUTH RINSE: 15.0000 mL | Freq: Once | OROMUCOSAL | Status: AC

## 2023-04-05 MED ORDER — PHENYLEPHRINE HCL (PRESSORS) 10 MG/ML IV SOLN
INTRAVENOUS | Status: AC
  Filled 2023-04-05: qty 1

## 2023-04-05 MED ORDER — TRANEXAMIC ACID-NACL 1000-0.7 MG/100ML-% IV SOLN
1000.0000 mg | INTRAVENOUS | Status: AC
  Administered 2023-04-05: 1000 mg via INTRAVENOUS
  Filled 2023-04-05: qty 100

## 2023-04-05 MED ORDER — FENTANYL CITRATE PF 50 MCG/ML IJ SOSY: 25.0000 ug | PREFILLED_SYRINGE | INTRAMUSCULAR | Status: DC | PRN

## 2023-04-05 MED ORDER — METHOCARBAMOL 500 MG PO TABS: 500.0000 mg | ORAL_TABLET | Freq: Four times a day (QID) | ORAL | Status: DC | PRN

## 2023-04-05 MED ORDER — DEXAMETHASONE SODIUM PHOSPHATE 10 MG/ML IJ SOLN
INTRAMUSCULAR | Status: DC | PRN
  Administered 2023-04-05: 10 mg via INTRAVENOUS

## 2023-04-05 MED ORDER — MIDAZOLAM HCL 2 MG/2ML IJ SOLN
INTRAMUSCULAR | Status: AC
  Filled 2023-04-05: qty 2

## 2023-04-05 MED ORDER — 0.9 % SODIUM CHLORIDE (POUR BTL) OPTIME
TOPICAL | Status: DC | PRN
  Administered 2023-04-05: 1000 mL

## 2023-04-05 MED ORDER — PROPOFOL 10 MG/ML IV BOLUS
INTRAVENOUS | Status: DC | PRN
Start: 2023-04-05 — End: 2023-04-05
  Administered 2023-04-05: 80 ug/kg/min via INTRAVENOUS

## 2023-04-05 MED ORDER — BUPIVACAINE IN DEXTROSE 0.75-8.25 % IT SOLN
INTRATHECAL | Status: DC | PRN
Start: 2023-04-05 — End: 2023-04-05
  Administered 2023-04-05: 2 mL via INTRATHECAL

## 2023-04-05 MED ORDER — POVIDONE-IODINE 10 % EX SWAB
2.0000 | Freq: Once | CUTANEOUS | Status: AC
  Administered 2023-04-05: 2 via TOPICAL

## 2023-04-05 MED ORDER — SODIUM CHLORIDE 0.9 % IV SOLN: INTRAVENOUS | Status: DC

## 2023-04-05 MED ORDER — OXYCODONE HCL 5 MG/5ML PO SOLN: 5.0000 mg | Freq: Once | ORAL | Status: DC | PRN

## 2023-04-05 MED ORDER — HYDROCODONE-ACETAMINOPHEN 7.5-325 MG PO TABS
1.0000 | ORAL_TABLET | ORAL | Status: DC | PRN
  Administered 2023-04-05: 1 via ORAL
  Filled 2023-04-05: qty 1

## 2023-04-05 MED ORDER — KETOROLAC TROMETHAMINE 15 MG/ML IJ SOLN
7.5000 mg | Freq: Four times a day (QID) | INTRAMUSCULAR | Status: AC
  Administered 2023-04-05 – 2023-04-06 (×3): 7.5 mg via INTRAVENOUS
  Filled 2023-04-05 (×3): qty 1

## 2023-04-05 MED ORDER — LIDOCAINE HCL (PF) 2 % IJ SOLN
INTRAMUSCULAR | Status: DC | PRN
Start: 2023-04-05 — End: 2023-04-05
  Administered 2023-04-05: 60 mg via INTRADERMAL

## 2023-04-05 MED ORDER — LIDOCAINE HCL (PF) 2 % IJ SOLN
INTRAMUSCULAR | Status: AC
  Filled 2023-04-05: qty 5

## 2023-04-05 MED ORDER — HYDROCODONE-ACETAMINOPHEN 5-325 MG PO TABS
1.0000 | ORAL_TABLET | ORAL | Status: DC | PRN
  Administered 2023-04-06: 2 via ORAL
  Filled 2023-04-05: qty 2

## 2023-04-05 MED ORDER — SODIUM CHLORIDE 0.9 % IR SOLN
Status: DC | PRN
  Administered 2023-04-05: 3000 mL
  Administered 2023-04-05: 1000 mL

## 2023-04-05 MED ORDER — DIPHENHYDRAMINE HCL 12.5 MG/5ML PO ELIX: 12.5000 mg | ORAL_SOLUTION | ORAL | Status: DC | PRN

## 2023-04-05 MED ORDER — DOCUSATE SODIUM 100 MG PO CAPS
100.0000 mg | ORAL_CAPSULE | Freq: Two times a day (BID) | ORAL | Status: DC
  Administered 2023-04-05 – 2023-04-06 (×2): 100 mg via ORAL
  Filled 2023-04-05 (×2): qty 1

## 2023-04-05 MED ORDER — ONDANSETRON HCL 4 MG/2ML IJ SOLN: 4.0000 mg | Freq: Four times a day (QID) | INTRAMUSCULAR | Status: DC | PRN

## 2023-04-05 MED ORDER — MENTHOL 3 MG MT LOZG: 1.0000 | LOZENGE | OROMUCOSAL | Status: DC | PRN

## 2023-04-05 SURGICAL SUPPLY — 54 items
ADH SKN CLS APL DERMABOND .7 (GAUZE/BANDAGES/DRESSINGS) ×2
APL PRP STRL LF DISP 70% ISPRP (MISCELLANEOUS) ×1
BAG COUNTER SPONGE SURGICOUNT (BAG) IMPLANT
BAG SPEC THK2 15X12 ZIP CLS (MISCELLANEOUS)
BAG SPNG CNTER NS LX DISP (BAG)
BAG ZIPLOCK 12X15 (MISCELLANEOUS) IMPLANT
CHLORAPREP W/TINT 26 (MISCELLANEOUS) ×2 IMPLANT
COVER PERINEAL POST (MISCELLANEOUS) ×2 IMPLANT
COVER SURGICAL LIGHT HANDLE (MISCELLANEOUS) ×2 IMPLANT
DERMABOND ADVANCED .7 DNX12 (GAUZE/BANDAGES/DRESSINGS) ×4 IMPLANT
DRAPE IMP U-DRAPE 54X76 (DRAPES) ×2 IMPLANT
DRAPE SHEET LG 3/4 BI-LAMINATE (DRAPES) ×6 IMPLANT
DRAPE STERI IOBAN 125X83 (DRAPES) ×2 IMPLANT
DRAPE U-SHAPE 47X51 STRL (DRAPES) ×2 IMPLANT
DRSG AQUACEL AG ADV 3.5X10 (GAUZE/BANDAGES/DRESSINGS) ×2 IMPLANT
ELECT REM PT RETURN 15FT ADLT (MISCELLANEOUS) ×2 IMPLANT
GAUZE SPONGE 4X4 12PLY STRL (GAUZE/BANDAGES/DRESSINGS) ×2 IMPLANT
GLOVE BIO SURGEON STRL SZ7 (GLOVE) ×2 IMPLANT
GLOVE BIO SURGEON STRL SZ8.5 (GLOVE) ×4 IMPLANT
GLOVE BIOGEL PI IND STRL 7.5 (GLOVE) ×2 IMPLANT
GLOVE BIOGEL PI IND STRL 8.5 (GLOVE) ×2 IMPLANT
GOWN SPEC L3 XXLG W/TWL (GOWN DISPOSABLE) ×2 IMPLANT
GOWN STRL REUS W/ TWL XL LVL3 (GOWN DISPOSABLE) ×2 IMPLANT
GOWN STRL REUS W/TWL XL LVL3 (GOWN DISPOSABLE) ×1
HANDPIECE INTERPULSE COAX TIP (DISPOSABLE) ×1
HEAD CERAMIC BIOLOX 36 T1 STD (Head) IMPLANT
HOLDER FOLEY CATH W/STRAP (MISCELLANEOUS) ×2 IMPLANT
HOOD PEEL AWAY T7 (MISCELLANEOUS) ×6 IMPLANT
KIT TURNOVER KIT A (KITS) IMPLANT
LINER ACETAB OFF G7 5 36 +5 (Liner) IMPLANT
MANIFOLD NEPTUNE II (INSTRUMENTS) ×2 IMPLANT
MARKER SKIN DUAL TIP RULER LAB (MISCELLANEOUS) ×2 IMPLANT
NDL SAFETY ECLIP 18X1.5 (MISCELLANEOUS) ×2 IMPLANT
NDL SPNL 18GX3.5 QUINCKE PK (NEEDLE) ×2 IMPLANT
NEEDLE SPNL 18GX3.5 QUINCKE PK (NEEDLE) ×1 IMPLANT
PACK ANTERIOR HIP CUSTOM (KITS) ×2 IMPLANT
SAW OSC TIP CART 19.5X105X1.3 (SAW) ×2 IMPLANT
SEALER BIPOLAR AQUA 6.0 (INSTRUMENTS) ×2 IMPLANT
SET HNDPC FAN SPRY TIP SCT (DISPOSABLE) ×2 IMPLANT
SHELL ACET G7 4H 60 SZG (Shell) IMPLANT
SOLUTION PRONTOSAN WOUND 350ML (IRRIGATION / IRRIGATOR) ×2 IMPLANT
SPIKE FLUID TRANSFER (MISCELLANEOUS) ×2 IMPLANT
SUT MNCRL AB 3-0 PS2 18 (SUTURE) ×2 IMPLANT
SUT MON AB 2-0 CT1 36 (SUTURE) ×2 IMPLANT
SUT STRATAFIX PDO 1 14 VIOLET (SUTURE) ×1
SUT STRATFX PDO 1 14 VIOLET (SUTURE) ×1
SUT VIC AB 2-0 CT1 27 (SUTURE) ×1
SUT VIC AB 2-0 CT1 TAPERPNT 27 (SUTURE) IMPLANT
SUTURE STRATFX PDO 1 14 VIOLET (SUTURE) ×2 IMPLANT
SYR 3ML LL SCALE MARK (SYRINGE) ×2 IMPLANT
TAPERLOC MCRO PRMY FMRL 16X117 (Joint) IMPLANT
TRAY FOLEY MTR SLVR 16FR STAT (SET/KITS/TRAYS/PACK) IMPLANT
TUBE SUCTION HIGH CAP CLEAR NV (SUCTIONS) ×2 IMPLANT
WATER STERILE IRR 1000ML POUR (IV SOLUTION) ×2 IMPLANT

## 2023-04-05 NOTE — Op Note (Signed)
OPERATIVE REPORT  SURGEON: Samson Frederic, MD   ASSISTANT: Skip Mayer, PA-C.  PREOPERATIVE DIAGNOSIS: Left hip arthritis.   POSTOPERATIVE DIAGNOSIS: Left hip arthritis.   PROCEDURE: Left total hip arthroplasty, anterior approach.   IMPLANTS: Biomet Taperloc Complete Microplasty stem, size 16 x 117 mm, high offset. Biomet G7 OsseoTi Cup, size 60 mm. Biomet Vivacit-E liner, size 36 mm, G, +5 mm neutral. Biomet Biolox ceramic head ball, size 36 + 0 mm.  ANESTHESIA:  MAC and Spinal  ESTIMATED BLOOD LOSS:-400 mL    ANTIBIOTICS: 3g Ancef.  DRAINS: None.  COMPLICATIONS: None.   CONDITION: PACU - hemodynamically stable.   BRIEF CLINICAL NOTE: Alfred Blair is a 72 y.o. male with a long-standing history of Left hip arthritis. After failing conservative management, the patient was indicated for total hip arthroplasty. The risks, benefits, and alternatives to the procedure were explained, and the patient elected to proceed.  PROCEDURE IN DETAIL: Surgical site was marked by myself in the pre-op holding area. Once inside the operating room, spinal anesthesia was obtained, and a foley catheter was inserted. The patient was then positioned on the Hana table.  All bony prominences were well padded.  The hip was prepped and draped in the normal sterile surgical fashion.  A time-out was called verifying side and site of surgery. The patient received IV antibiotics within 60 minutes of beginning the procedure.   Bikini incision was made, and superficial dissection was performed lateral to the ASIS. The direct anterior approach to the hip was performed through the Hueter interval.  Lateral femoral circumflex vessels were treated with the Auqumantys. The anterior capsule was exposed and an inverted T capsulotomy was made. The femoral neck cut was made to the level of the templated cut.  A corkscrew was placed into the head and the head was removed.  The femoral head was found to have  eburnated bone. The head was passed to the back table and was measured. Pubofemoral ligament was released off of the calcar, taking care to stay on bone. Superior capsule was released from the greater trochanter, taking care to stay lateral to the posterior border of the femoral neck in order to preserve the short external rotators.   Acetabular exposure was achieved, and the pulvinar and labrum were excised. Sequential reaming of the acetabulum was then performed up to a size 59 mm reamer. A 60 mm cup was then opened and impacted into place at approximately 40 degrees of abduction and 20 degrees of anteversion. The final polyethylene liner was impacted into place and acetabular osteophytes were removed.    I then gained femoral exposure taking care to protect the abductors and greater trochanter.  This was performed using standard external rotation, extension, and adduction.  A cookie cutter was used to enter the femoral canal, and then the femoral canal finder was placed.  Sequential broaching was performed up to a size 16.  Calcar planer was used on the femoral neck remnant.  I placed a high offset neck and a trial head ball.  The hip was reduced.  Leg lengths and offset were checked fluoroscopically.  The hip was dislocated and trial components were removed.  The final implants were placed, and the hip was reduced.  Fluoroscopy was used to confirm component position and leg lengths.  At 90 degrees of external rotation and full extension, the hip was stable to an anterior directed force.   The wound was copiously irrigated with Prontosan solution and normal saline using pulse  lavage.  Marcaine solution was injected into the periarticular soft tissue.  The wound was closed in layers using #1 Vicryl and V-Loc for the fascia, 2-0 Vicryl for the subcutaneous fat, 2-0 Monocryl for the deep dermal layer, 3-0 running Monocryl subcuticular stitch, and Dermabond for the skin.  Once the glue was fully dried, an  Aquacell Ag dressing was applied.  The patient was transported to the recovery room in stable condition.  Sponge, needle, and instrument counts were correct at the end of the case x2.  The patient tolerated the procedure well and there were no known complications.  The aquamantis was utilized for this case to help facilitate better hemostasis as patient was felt to be at increased risk of bleeding because of obesity.  A oscillating saw tip was utilized for this case to prevent damage to the soft tissue structures such as muscles, ligaments and tendons, and to ensure accurate bone cuts. This patient was at increased risk for above structures due to  minimally invasive approach.  Please note that a surgical assistant was a medical necessity for this procedure to perform it in a safe and expeditious manner. Assistant was necessary to provide appropriate retraction of vital neurovascular structures, to prevent femoral fracture, and to allow for anatomic placement of the prosthesis.

## 2023-04-05 NOTE — Anesthesia Postprocedure Evaluation (Signed)
Anesthesia Post Note  Patient: Alfred Blair  Procedure(s) Performed: LEFT TOTAL HIP ARTHROPLASTY ANTERIOR APPROACH (Left: Hip)     Patient location during evaluation: PACU Anesthesia Type: Spinal Level of consciousness: awake and alert Pain management: pain level controlled Vital Signs Assessment: post-procedure vital signs reviewed and stable Respiratory status: spontaneous breathing, nonlabored ventilation and respiratory function stable Cardiovascular status: blood pressure returned to baseline Postop Assessment: no apparent nausea or vomiting, spinal receding, no headache and no backache Anesthetic complications: no   No notable events documented.  Last Vitals:  Vitals:   04/05/23 1200 04/05/23 1230  BP: 130/74 132/73  Pulse: (!) 57 (!) 57  Resp: (!) 8 11  Temp:    SpO2: 96% 98%    Last Pain:  Vitals:   04/05/23 1230  TempSrc:   PainSc: 0-No pain    LLE Motor Response: Purposeful movement (04/05/23 1230) LLE Sensation: Full sensation (04/05/23 1230) RLE Motor Response: Purposeful movement (04/05/23 1230) RLE Sensation: Full sensation (04/05/23 1230) L Sensory Level: S1-Sole of foot, small toes (04/05/23 1230) R Sensory Level: S1-Sole of foot, small toes (04/05/23 1230)  [name]

## 2023-04-05 NOTE — Discharge Instructions (Signed)
? ?Dr. Damario Gillie ?Joint Replacement Specialist ?[facility] ?[address] ?[place], [place] [address] ?[phone] ? ? ?TOTAL HIP REPLACEMENT POSTOPERATIVE DIRECTIONS ? ? ? ?Hip Rehabilitation, Guidelines Following Surgery  ? ?WEIGHT BEARING ?Weight bearing as tolerated with assist device (walker, cane, etc) as directed, use it as long as suggested by your surgeon or therapist, typically at least 4-6 weeks. ? ?The results of a hip operation are greatly improved after range of motion and muscle strengthening exercises. Follow all safety measures which are given to protect your hip. If any of these exercises cause increased pain or swelling in your joint, decrease the amount until you are comfortable again. Then slowly increase the exercises. Call your caregiver if you have problems or questions.  ? ?HOME CARE INSTRUCTIONS  ?Most of the following instructions are designed to prevent the dislocation of your new hip.  ?Remove items at home which could result in a fall. This includes throw rugs or furniture in walking pathways.  ?Continue medications as instructed at time of discharge. ?You may have some home medications which will be placed on hold until you complete the course of blood thinner medication. ?You may start showering once you are discharged home. Do not remove your dressing. ?Do not put on socks or shoes without following the instructions of your caregivers.   ?Sit on chairs with arms. Use the chair arms to help push yourself up when arising.  ?Arrange for the use of a toilet seat elevator so you are not sitting low.  ?Walk with walker as instructed.  ?You may resume a sexual relationship in one month or when given the OK by your caregiver.  ?Use walker as long as suggested by your caregivers.  ?You may put full weight on your legs and walk as much as is comfortable. ?Avoid periods of inactivity such as sitting longer than an hour when not asleep. This helps prevent blood  clots.  ?You may return to work once you are cleared by your surgeon.  ?Do not drive a car for 6 weeks or until released by your surgeon.  ?Do not drive while taking narcotics.  ?Wear elastic stockings for two weeks following surgery during the day but you may remove then at night.  ?Make sure you keep all of your appointments after your operation with all of your doctors and caregivers. You should call the office at the above phone number and make an appointment for approximately two weeks after the date of your surgery. ?Please pick up a stool softener and laxative for home use as long as you are requiring pain medications. ?ICE to the affected hip every three hours for 30 minutes at a time and then as needed for pain and swelling. Continue to use ice on the hip for pain and swelling from surgery. You may notice swelling that will progress down to the foot and ankle.  This is normal after surgery.  Elevate the leg when you are not up walking on it.   ?It is important for you to complete the blood thinner medication as prescribed by your doctor. ?Continue to use the breathing machine which will help keep your temperature down.  It is common for your temperature to cycle up and down following surgery, especially at night when you are not up moving around and exerting yourself.  The breathing machine keeps your lungs expanded and your temperature down. ? ?RANGE OF MOTION AND STRENGTHENING EXERCISES  ?These exercises are designed to help you   keep full movement of your hip joint. Follow your caregiver's or physical therapist's instructions. Perform all exercises about fifteen times, three times per day or as directed. Exercise both hips, even if you have had only one joint replacement. These exercises can be done on a training (exercise) mat, on the floor, on a table or on a bed. Use whatever works the best and is most comfortable for you. Use music or television while you are exercising so that the exercises are a  pleasant break in your day. This will make your life better with the exercises acting as a break in routine you can look forward to.  ?Lying on your back, slowly slide your foot toward your buttocks, raising your knee up off the floor. Then slowly slide your foot back down until your leg is straight again.  ?Lying on your back spread your legs as far apart as you can without causing discomfort.  ?Lying on your side, raise your upper leg and foot straight up from the floor as far as is comfortable. Slowly lower the leg and repeat.  ?Lying on your back, tighten up the muscle in the front of your thigh (quadriceps muscles). You can do this by keeping your leg straight and trying to raise your heel off the floor. This helps strengthen the largest muscle supporting your knee.  ?Lying on your back, tighten up the muscles of your buttocks both with the legs straight and with the knee bent at a comfortable angle while keeping your heel on the floor.  ? ?SKILLED REHAB INSTRUCTIONS: ?If the patient is transferred to a skilled rehab facility following release from the hospital, a list of the current medications will be sent to the facility for the patient to continue.  When discharged from the skilled rehab facility, please have the facility set up the patient's Home Health Physical Therapy prior to being released. Also, the skilled facility will be responsible for providing the patient with their medications at time of release from the facility to include their pain medication and their blood thinner medication. If the patient is still at the rehab facility at time of the two week follow up appointment, the skilled rehab facility will also need to assist the patient in arranging follow up appointment in our office and any transportation needs. ? ?POST-OPERATIVE OPIOID TAPER INSTRUCTIONS: ?It is important to wean off of your opioid medication as soon as possible. If you do not need pain medication after your surgery it is ok  to stop day one. ?Opioids include: ?Codeine, Hydrocodone(Norco, Vicodin), Oxycodone(Percocet, oxycontin) and hydromorphone amongst others.  ?Long term and even short term use of opiods can cause: ?Increased pain response ?Dependence ?Constipation ?Depression ?Respiratory depression ?And more.  ?Withdrawal symptoms can include ?Flu like symptoms ?Nausea, vomiting ?And more ?Techniques to manage these symptoms ?Hydrate well ?Eat regular healthy meals ?Stay active ?Use relaxation techniques(deep breathing, meditating, yoga) ?Do Not substitute Alcohol to help with tapering ?If you have been on opioids for less than two weeks and do not have pain than it is ok to stop all together.  ?Plan to wean off of opioids ?This plan should start within one week post op of your joint replacement. ?Maintain the same interval or time between taking each dose and first decrease the dose.  ?Cut the total daily intake of opioids by one tablet each day ?Next start to increase the time between doses. ?The last dose that should be eliminated is the evening dose.  ? ? ?MAKE   SURE YOU:  ?Understand these instructions.  ?Will watch your condition.  ?Will get help right away if you are not doing well or get worse. ? ?Pick up stool softner and laxative for home use following surgery while on pain medications. ?Do not remove your dressing. ?The dressing is waterproof--it is OK to take showers. ?Continue to use ice for pain and swelling after surgery. ?Do not use any lotions or creams on the incision until instructed by your surgeon. ?Total Hip Protocol. ? ?

## 2023-04-05 NOTE — Evaluation (Signed)
Physical Therapy Evaluation Patient Details Name: Alfred Blair MRN: [id number] DOB: 1951-03-01 Today's Date: 04/05/2023  History of Present Illness  Pt s/p L THR and with hx of colon CA  Clinical Impression  Pt s/p L THR and presents with decreased L LE strength/ROM and post op pain limiting functional mobility.  Pt should progress to dc home with family assist.      If plan is discharge home, recommend the following: A little help with walking and/or transfers;A little help with bathing/dressing/bathroom;Assistance with cooking/housework;Help with stairs or ramp for entrance;Assist for transportation   Can travel by private vehicle        Equipment Recommendations Rolling walker (2 wheels)  Recommendations for Other Services       Functional Status Assessment Patient has had a recent decline in their functional status and demonstrates the ability to make significant improvements in function in a reasonable and predictable amount of time.     Precautions / Restrictions Precautions Precautions: Fall Restrictions Weight Bearing Restrictions: No Other Position/Activity Restrictions: WBAT      Mobility  Bed Mobility Overal bed mobility: Needs Assistance Bed Mobility: Supine to Sit     Supine to sit: Min assist     General bed mobility comments: cues for sequence and use of R LE to self assist    Transfers Overall transfer level: Needs assistance Equipment used: Rolling walker (2 wheels) Transfers: Sit to/from Stand Sit to Stand: Min assist           General transfer comment: cues for LE management and use of UEs to self assist    Ambulation/Gait Ambulation/Gait assistance: Min assist, Contact guard assist Gait Distance (Feet): 54 Feet Assistive device: Rolling walker (2 wheels) Gait Pattern/deviations: Step-to pattern, Step-through pattern, Decreased step length - right, Decreased step length - left, Shuffle, Trunk flexed Gait velocity: decr      General Gait Details: cues for posture, position from RW, stride length and initial sequence  Stairs            Wheelchair Mobility     Tilt Bed    Modified Rankin (Stroke Patients Only)       Balance Overall balance assessment: Needs assistance Sitting-balance support: No upper extremity supported, Feet supported Sitting balance-Leahy Scale: Good     Standing balance support: Bilateral upper extremity supported Standing balance-Leahy Scale: Poor                               Pertinent Vitals/Pain Pain Assessment Pain Assessment: 0-10 Pain Score: 7  Pain Location: L hip Pain Descriptors / Indicators: Aching, Sore Pain Intervention(s): Limited activity within patient's tolerance, Monitored during session, Premedicated before session, Ice applied    Home Living Family/patient expects to be discharged to:: Private residence Living Arrangements: Spouse/significant other Available Help at Discharge: Family;Available 24 hours/day Type of Home: House Home Access: Stairs to enter Entrance Stairs-Rails: None Entrance Stairs-Number of Steps: 3   Home Layout: One level Home Equipment: None Additional Comments: Pt will be staying at dtrs house initially - information above pertains to dtr's house    Prior Function Prior Level of Function : Independent/Modified Independent                     Extremity/Trunk Assessment   Upper Extremity Assessment Upper Extremity Assessment: Overall WFL for tasks assessed    Lower Extremity Assessment Lower Extremity Assessment: LLE deficits/detail    Cervical /  Trunk Assessment Cervical / Trunk Assessment: Normal  Communication   Communication Communication: No apparent difficulties  Cognition Arousal: Alert Behavior During Therapy: WFL for tasks assessed/performed Overall Cognitive Status: Within Functional Limits for tasks assessed                                          General  Comments      Exercises Total Joint Exercises Ankle Circles/Pumps: AROM, Both, 15 reps, Supine   Assessment/Plan    PT Assessment Patient needs continued PT services  PT Problem List Decreased strength;Decreased activity tolerance;Decreased range of motion;Decreased balance;Decreased mobility;Decreased knowledge of use of DME;Pain       PT Treatment Interventions DME instruction;Functional mobility training;Stair training;Gait training;Therapeutic activities;Therapeutic exercise;Patient/family education    PT Goals (Current goals can be found in the Care Plan section)  Acute Rehab PT Goals Patient Stated Goal: REgain IND PT Goal Formulation: With patient Time For Goal Achievement: 04/12/23 Potential to Achieve Goals: Good    Frequency 7X/week     Co-evaluation               AM-PAC PT "6 Clicks" Mobility  Outcome Measure Help needed turning from your back to your side while in a flat bed without using bedrails?: A Little Help needed moving from lying on your back to sitting on the side of a flat bed without using bedrails?: A Little Help needed moving to and from a bed to a chair (including a wheelchair)?: A Little Help needed standing up from a chair using your arms (e.g., wheelchair or bedside chair)?: A Little Help needed to walk in hospital room?: A Little Help needed climbing 3-5 steps with a railing? : A Lot 6 Click Score: 17    End of Session Equipment Utilized During Treatment: Gait belt Activity Tolerance: Patient tolerated treatment well Patient left: in chair;with call bell/phone within reach;with chair alarm set;with family/visitor present;with nursing/sitter in room Nurse Communication: Mobility status PT Visit Diagnosis: Difficulty in walking, not elsewhere classified (R26.2)    Time: 1440-1506 PT Time Calculation (min) (ACUTE ONLY): 26 min   Charges:   PT Evaluation $PT Eval Low Complexity: 1 Low PT Treatments $Gait Training: 8-22 mins PT  General Charges $$ ACUTE PT VISIT: 1 Visit         Mauro Kaufmann PT Acute Rehabilitation Services Pager [phone] Office [phone]   Dyron Kawano 04/05/2023, 3:31 PM

## 2023-04-05 NOTE — Anesthesia Procedure Notes (Signed)
Spinal  Patient location during procedure: OR Start time: 04/05/2023 7:34 AM End time: 04/05/2023 7:37 AM Reason for block: surgical anesthesia Staffing Performed: anesthesiologist  Anesthesiologist: Kaylyn Layer, MD Performed by: Kaylyn Layer, MD Authorized by: Kaylyn Layer, MD   Preanesthetic Checklist Completed: patient identified, IV checked, risks and benefits discussed, surgical consent, monitors and equipment checked, pre-op evaluation and timeout performed Spinal Block Patient position: sitting Prep: DuraPrep and site prepped and draped Patient monitoring: continuous pulse ox, blood pressure and heart rate Approach: midline Location: L3-4 Injection technique: single-shot Needle Needle type: Pencan  Needle gauge: 24 G Needle length: 9 cm Assessment Events: CSF return Additional Notes Risks, benefits, and alternative discussed. Patient gave consent to procedure. Prepped and draped in sitting position. Patient sedated but responsive to voice. Clear CSF obtained after one needle pass. Positive terminal aspiration. No pain or paraesthesias with injection. Patient tolerated procedure well. Vital signs stable. Amalia Greenhouse, MD

## 2023-04-05 NOTE — Plan of Care (Signed)
  Problem: Education: Goal: Ability to describe self-care measures that may prevent or decrease complications (Diabetes Survival Skills Education) will improve Outcome: Progressing   Problem: Education: Goal: Knowledge of the prescribed therapeutic regimen will improve Outcome: Progressing   Problem: Activity: Goal: Ability to avoid complications of mobility impairment will improve Outcome: Progressing   

## 2023-04-05 NOTE — Interval H&P Note (Signed)
History and Physical Interval Note:  04/05/2023 7:31 AM  Alfred Blair  has presented today for surgery, with the diagnosis of Left hip osteoarthritis.  The various methods of treatment have been discussed with the patient and family. After consideration of risks, benefits and other options for treatment, the patient has consented to  Procedure(s) with comments: TOTAL HIP ARTHROPLASTY ANTERIOR APPROACH (Left) - 130 as a surgical intervention.  The patient's history has been reviewed, patient examined, no change in status, stable for surgery.  I have reviewed the patient's chart and labs.  Questions were answered to the patient's satisfaction.     [name]

## 2023-04-05 NOTE — Transfer of Care (Signed)
Immediate Anesthesia Transfer of Care Note  Patient: Alfred Blair  Procedure(s) Performed: LEFT TOTAL HIP ARTHROPLASTY ANTERIOR APPROACH (Left: Hip)  Patient Location: PACU  Anesthesia Type:Spinal  Level of Consciousness: awake, alert , oriented, and patient cooperative  Airway & Oxygen Therapy: Patient Spontanous Breathing and Patient connected to face mask oxygen  Post-op Assessment: Report given to RN and Post -op Vital signs reviewed and stable  Post vital signs: Reviewed and stable  Last Vitals:  Vitals Value Taken Time  BP 124/66 04/05/23 1008  Temp    Pulse 74 04/05/23 1010  Resp 14 04/05/23 1010  SpO2 100 % 04/05/23 1010  Vitals shown include unfiled device data.  Last Pain:  Vitals:   04/05/23 0625  TempSrc:   PainSc: 5       Patients Stated Pain Goal: 0 (04/05/23 6045)  Complications: No notable events documented.

## 2023-04-06 ENCOUNTER — Encounter (HOSPITAL_COMMUNITY): Payer: Self-pay | Admitting: Orthopedic Surgery

## 2023-04-06 DIAGNOSIS — M1612 Unilateral primary osteoarthritis, left hip: Secondary | ICD-10-CM | POA: Diagnosis not present

## 2023-04-06 LAB — GLUCOSE, CAPILLARY
Glucose-Capillary: 110 mg/dL — ABNORMAL HIGH (ref 70–99)
Glucose-Capillary: 101 mg/dL — ABNORMAL HIGH (ref 70–99)

## 2023-04-06 MED ORDER — METHOCARBAMOL 500 MG PO TABS: 500.0000 mg | ORAL_TABLET | Freq: Four times a day (QID) | ORAL | 0 refills | Status: AC | PRN

## 2023-04-06 MED ORDER — SENNA 8.6 MG PO TABS: 2.0000 | ORAL_TABLET | Freq: Every day | ORAL | 0 refills | Status: AC

## 2023-04-06 MED ORDER — ONDANSETRON HCL 4 MG PO TABS: 4.0000 mg | ORAL_TABLET | Freq: Four times a day (QID) | ORAL | 0 refills | Status: AC | PRN

## 2023-04-06 MED ORDER — HYDROCODONE-ACETAMINOPHEN 5-325 MG PO TABS: 1.0000 | ORAL_TABLET | ORAL | 0 refills | Status: AC | PRN

## 2023-04-06 MED ORDER — POLYETHYLENE GLYCOL 3350 17 G PO PACK: 17.0000 g | PACK | Freq: Every day | ORAL | 0 refills | Status: AC | PRN

## 2023-04-06 MED ORDER — ASPIRIN 81 MG PO CHEW: 81.0000 mg | CHEWABLE_TABLET | Freq: Two times a day (BID) | ORAL | 0 refills | Status: AC

## 2023-04-06 MED ORDER — DOCUSATE SODIUM 100 MG PO CAPS: 100.0000 mg | ORAL_CAPSULE | Freq: Two times a day (BID) | ORAL | 0 refills | Status: AC

## 2023-04-06 NOTE — Plan of Care (Signed)
  Problem: Education: Goal: Ability to describe self-care measures that may prevent or decrease complications (Diabetes Survival Skills Education) will improve Outcome: Progressing Goal: Individualized Educational Video(s) Outcome: Progressing   Problem: Coping: Goal: Ability to adjust to condition or change in health will improve Outcome: Progressing   Problem: Fluid Volume: Goal: Ability to maintain a balanced intake and output will improve Outcome: Progressing   Problem: Health Behavior/Discharge Planning: Goal: Ability to identify and utilize available resources and services will improve Outcome: Progressing Goal: Ability to manage health-related needs will improve Outcome: Progressing   Problem: Metabolic: Goal: Ability to maintain appropriate glucose levels will improve Outcome: Progressing   Problem: Nutritional: Goal: Maintenance of adequate nutrition will improve Outcome: Progressing Goal: Progress toward achieving an optimal weight will improve Outcome: Progressing   Problem: Skin Integrity: Goal: Risk for impaired skin integrity will decrease Outcome: Progressing   Problem: Tissue Perfusion: Goal: Adequacy of tissue perfusion will improve Outcome: Progressing   Problem: Education: Goal: Knowledge of the prescribed therapeutic regimen will improve Outcome: Progressing Goal: Understanding of discharge needs will improve Outcome: Progressing Goal: Individualized Educational Video(s) Outcome: Progressing   Problem: Activity: Goal: Ability to avoid complications of mobility impairment will improve Outcome: Progressing Goal: Ability to tolerate increased activity will improve Outcome: Progressing   Problem: Clinical Measurements: Goal: Postoperative complications will be avoided or minimized Outcome: Progressing   Problem: Pain Management: Goal: Pain level will decrease with appropriate interventions Outcome: Progressing   Problem: Skin  Integrity: Goal: Will show signs of wound healing Outcome: Progressing   Problem: Education: Goal: Knowledge of General Education information will improve Description: Including pain rating scale, medication(s)/side effects and non-pharmacologic comfort measures Outcome: Progressing   Problem: Health Behavior/Discharge Planning: Goal: Ability to manage health-related needs will improve Outcome: Progressing   Problem: Clinical Measurements: Goal: Ability to maintain clinical measurements within normal limits will improve Outcome: Progressing Goal: Will remain free from infection Outcome: Progressing Goal: Diagnostic test results will improve Outcome: Progressing Goal: Respiratory complications will improve Outcome: Progressing Goal: Cardiovascular complication will be avoided Outcome: Progressing   Problem: Activity: Goal: Risk for activity intolerance will decrease Outcome: Progressing   Problem: Nutrition: Goal: Adequate nutrition will be maintained Outcome: Progressing   Problem: Coping: Goal: Level of anxiety will decrease Outcome: Progressing   Problem: Elimination: Goal: Will not experience complications related to bowel motility Outcome: Progressing Goal: Will not experience complications related to urinary retention Outcome: Progressing   Problem: Pain Managment: Goal: General experience of comfort will improve Outcome: Progressing   Problem: Safety: Goal: Ability to remain free from injury will improve Outcome: Progressing   Problem: Skin Integrity: Goal: Risk for impaired skin integrity will decrease Outcome: Progressing   

## 2023-04-06 NOTE — Plan of Care (Signed)
Patient discharged home following education. Urine concentrated and patient encouraged to drink liquids along with all other routine discharge instructions. Haydee Salter, RN 04/06/23 5:01 PM

## 2023-04-06 NOTE — Progress Notes (Addendum)
Physical Therapy Treatment Patient Details Name: Alfred Blair MRN: [id number] DOB: 23-Feb-1951 Today's Date: 04/06/2023   History of Present Illness Pt s/p L THR and with hx of colon CA    PT Comments  Pt continues very motivated and progressing well with mobility.  Pt up to ambulate in hall, negotiated stairs, reviewed car transfers, performed HEP with written instruction provided and reviewed; and up to bathroom to brush teeth standing at sink.  Pt eager for dc home this date.   If plan is discharge home, recommend the following: A little help with walking and/or transfers;A little help with bathing/dressing/bathroom;Assistance with cooking/housework;Help with stairs or ramp for entrance;Assist for transportation   Can travel by private vehicle        Equipment Recommendations  Rolling walker (2 wheels)    Recommendations for Other Services       Precautions / Restrictions Precautions Precautions: Fall Restrictions Weight Bearing Restrictions: No LLE Weight Bearing: Weight bearing as tolerated Other Position/Activity Restrictions: WBAT     Mobility  Bed Mobility Overal bed mobility: Needs Assistance Bed Mobility: Supine to Sit     Supine to sit: Supervision     General bed mobility comments: cues for sequence and use of R LE to self assist    Transfers Overall transfer level: Needs assistance Equipment used: Rolling walker (2 wheels) Transfers: Sit to/from Stand Sit to Stand: Contact guard assist, Supervision           General transfer comment: cues for LE management and use of UEs to self assist    Ambulation/Gait Ambulation/Gait assistance: Contact guard assist, Supervision Gait Distance (Feet): 120 Feet Assistive device: Rolling walker (2 wheels) Gait Pattern/deviations: Step-to pattern, Step-through pattern, Decreased step length - right, Decreased step length - left, Shuffle, Trunk flexed Gait velocity: decr     General Gait Details: cues for  posture, position from RW, stride length and initial sequence   Stairs Stairs: Yes Stairs assistance: Min assist Stair Management: No rails, With cane Number of Stairs: 4 General stair comments: 2 steps with bil canes, 2 steps with single cane, cues for sequence   Wheelchair Mobility     Tilt Bed    Modified Rankin (Stroke Patients Only)       Balance Overall balance assessment: Needs assistance Sitting-balance support: No upper extremity supported, Feet supported Sitting balance-Leahy Scale: Good     Standing balance support: No upper extremity supported Standing balance-Leahy Scale: Fair                              Cognition Arousal: Alert Behavior During Therapy: WFL for tasks assessed/performed Overall Cognitive Status: Within Functional Limits for tasks assessed                                          Exercises Total Joint Exercises Ankle Circles/Pumps: AROM, Both, 15 reps, Supine Quad Sets: AROM, Both, 10 reps, Supine Heel Slides: AAROM, Left, 20 reps, Supine Hip ABduction/ADduction: AAROM, Left, 5 reps, Standing Long Arc Quad: AAROM, AROM, Left, 10 reps, Seated    General Comments        Pertinent Vitals/Pain Pain Assessment Pain Assessment: 0-10 Pain Score: 5  Pain Location: L hip Pain Descriptors / Indicators: Aching, Sore Pain Intervention(s): Limited activity within patient's tolerance, Monitored during session, Premedicated before session  Home Living                          Prior Function            PT Goals (current goals can now be found in the care plan section) Acute Rehab PT Goals Patient Stated Goal: REgain IND PT Goal Formulation: With patient Time For Goal Achievement: 04/12/23 Potential to Achieve Goals: Good Progress towards PT goals: Progressing toward goals    Frequency    7X/week      PT Plan      Co-evaluation              AM-PAC PT "6 Clicks" Mobility    Outcome Measure  Help needed turning from your back to your side while in a flat bed without using bedrails?: A Little Help needed moving from lying on your back to sitting on the side of a flat bed without using bedrails?: A Little Help needed moving to and from a bed to a chair (including a wheelchair)?: A Little Help needed standing up from a chair using your arms (e.g., wheelchair or bedside chair)?: A Little Help needed to walk in hospital room?: A Little Help needed climbing 3-5 steps with a railing? : A Little 6 Click Score: 18    End of Session Equipment Utilized During Treatment: Gait belt Activity Tolerance: Patient tolerated treatment well Patient left: in chair;with call bell/phone within reach;with chair alarm set;with family/visitor present;with nursing/sitter in room Nurse Communication: Mobility status PT Visit Diagnosis: Difficulty in walking, not elsewhere classified (R26.2)     Time: 2440-1027 PT Time Calculation (min) (ACUTE ONLY): 44 min  Charges:    $Gait Training: 8-22 mins $Therapeutic Exercise: 8-22 mins $Therapeutic Activity: 8-22 mins PT General Charges $$ ACUTE PT VISIT: 1 Visit                     Mauro Kaufmann PT Acute Rehabilitation Services Pager [phone] Office [phone]    Alfred Blair 04/06/2023, 12:58 PM

## 2023-04-06 NOTE — TOC Transition Note (Signed)
Transition of Care [facility]) - CM/SW Discharge Note  Patient Details  Name: Alfred Blair MRN: [id number] Date of Birth: 07/02/1951  [facility] of [facility]) CM/SW Contact:  Ewing Schlein, LCSW Phone Number: 04/06/2023, 11:21 AM  Clinical Narrative: Patient is expected to discharge home after working with PT. CSW met with patient to confirm discharge plan and needs. Patient will go home with a home exercise program (HEP). Patient will need a rolling walker, which [employer] delivered to patient's room. TOC signing off.  Final next level of care: Home/Self Care Barriers to Discharge: No Barriers Identified  Patient Goals and CMS Choice CMS Medicare.gov Compare Post Acute Care list provided to:: Patient Choice offered to / list presented to : Patient  Discharge Plan and Services Additional resources added to the After Visit Summary for        DME Arranged: Walker rolling DME Agency: [employer] Date DME Agency Contacted: 04/06/23 Representative spoke with at DME Agency: Yvonna Alanis  Social Determinants of Health (SDOH) Interventions SDOH Screenings   Food Insecurity: Patient Declined (04/05/2023)  Housing: High Risk (04/05/2023)  Transportation Needs: Patient Declined (04/05/2023)  Utilities: Patient Declined (04/05/2023)  Financial Resource Strain: Low Risk  (10/13/2022)   Received from [facility], [facility]  Physical Activity: Unknown (10/13/2022)   Received from [facility], [facility]  Social Connections: Moderately Integrated (10/13/2022)   Received from [facility], [facility]  Stress: No Stress Concern Present (12/25/2022)   Received from [facility], [facility]  Tobacco Use: Low Risk  (04/05/2023)   Readmission Risk Interventions     No data to display

## 2023-04-06 NOTE — Progress Notes (Signed)
    Subjective:  Patient reports pain as mild to moderate.  Denies N/V/CP/SOB. Foley d/c'd - hasn't voided yet  Objective:   VITALS:   Vitals:   04/05/23 2146 04/06/23 0144 04/06/23 0552 04/06/23 1026  BP: (!) 105/56 (!) 105/54 (!) 130/58 (!) 116/57  Pulse: 61 63 65 70  Resp: 17 17 17 18   Temp: 97.8 F (36.6 C) 97.9 F (36.6 C) 97.7 F (36.5 C) 97.8 F (36.6 C)  TempSrc: Oral Oral Oral   SpO2: 96% 94% 95% 99%  Weight:      Height:        NAD ABD soft Sensation intact distally Intact pulses distally Dorsiflexion/Plantar flexion intact Incision: dressing C/D/I Compartment soft   Lab Results  Component Value Date   WBC 7.2 04/06/2023   HGB 8.8 (L) 04/06/2023   HCT 25.2 (L) 04/06/2023   MCV 96.6 04/06/2023   PLT 126 (L) 04/06/2023   BMET    Component Value Date/Time   NA 134 (L) 04/06/2023 0347   K 4.3 04/06/2023 0347   CL 101 04/06/2023 0347   CO2 25 04/06/2023 0347   GLUCOSE 124 (H) 04/06/2023 0347   BUN 30 (H) 04/06/2023 0347   CREATININE 1.45 (H) 04/06/2023 0347   CALCIUM 8.2 (L) 04/06/2023 0347   GFRNONAA 52 (L) 04/06/2023 0347     Assessment/Plan: 1 Day Post-Op   Principal Problem:   Osteoarthritis of left hip Active Problems:   Primary osteoarthritis of left hip   WBAT with walker DVT ppx: Aspirin, SCDs, TEDS PO pain control PT/OT Dispo: will recheck 2 PM BMP due to mild elevation in Cr, d/c home once he voids and Cr improves  Iline Oven Falon Huesca 04/06/2023, 12:01 PM   Samson Frederic, MD [phone] [facility] is now [facility]  [place] [address], [address], [place], [place] [address] Phone: [phone] [email or web address] Facebook  [employer]

## 2023-04-06 NOTE — Discharge Summary (Signed)
Physician Discharge Summary  Patient ID: Alfred Blair MRN: [id number] DOB/AGE: [birth year] 72 y.o.  Admit date: 04/05/2023 Discharge date: 04/06/2023  Admission Diagnoses:  Osteoarthritis of left hip  Discharge Diagnoses:  Principal Problem:   Osteoarthritis of left hip Active Problems:   Primary osteoarthritis of left hip   Past Medical History:  Diagnosis Date   Anxiety    Asthma    Cancer (HCC)    colon   GERD (gastroesophageal reflux disease)    Hypertension    Kidney stone    Pneumonia    Pre-diabetes    Shortness of breath     Surgeries: Procedure(s): LEFT TOTAL HIP ARTHROPLASTY ANTERIOR APPROACH on 04/05/2023   Consultants (if any):   Discharged Condition: Improved  Hospital Course: Alfred Blair is an 72 y.o. male who was admitted 04/05/2023 with a diagnosis of Osteoarthritis of left hip and went to the operating room on 04/05/2023 and underwent the above named procedures.    He was given perioperative antibiotics:  Anti-infectives (From admission, onward)    Start     Dose/Rate Route Frequency Ordered Stop   04/05/23 1400  ceFAZolin (ANCEF) IVPB 2g/100 mL premix        2 g 200 mL/hr over 30 Minutes Intravenous Every 6 hours 04/05/23 0959 04/05/23 2202   04/05/23 0630  ceFAZolin (ANCEF) IVPB 3g/100 mL premix        3 g 200 mL/hr over 30 Minutes Intravenous On call to O.R. 04/05/23 0619 04/05/23 0800       He was given sequential compression devices, early ambulation, and ASA for DVT prophylaxis.  He benefited maximally from the hospital stay and there were no complications.    Recent vital signs:  Vitals:   04/06/23 0552 04/06/23 1026  BP: (!) 130/58 (!) 116/57  Pulse: 65 70  Resp: 17 18  Temp: 97.7 F (36.5 C) 97.8 F (36.6 C)  SpO2: 95% 99%    Recent laboratory studies:  Lab Results  Component Value Date   HGB 8.8 (L) 04/06/2023   HGB 12.6 (L) 03/26/2023   Lab Results  Component Value Date   WBC 7.2 04/06/2023   PLT 126 (L)  04/06/2023   No results found for: "INR" Lab Results  Component Value Date   NA 134 (L) 04/06/2023   K 4.3 04/06/2023   CL 101 04/06/2023   CO2 25 04/06/2023   BUN 30 (H) 04/06/2023   CREATININE 1.45 (H) 04/06/2023   GLUCOSE 124 (H) 04/06/2023     Allergies as of 04/06/2023       Reactions   Fish Allergy Anaphylaxis        Medication List     STOP taking these medications    ibuprofen 200 MG tablet Commonly known as: ADVIL   meloxicam 15 MG tablet Commonly known as: MOBIC   traMADol 50 MG tablet Commonly known as: ULTRAM       TAKE these medications    ADULT GUMMY PO Take 1 tablet by mouth daily.   aspirin 81 MG chewable tablet Chew 1 tablet (81 mg total) by mouth 2 (two) times daily.   docusate sodium 100 MG capsule Commonly known as: COLACE Take 1 capsule (100 mg total) by mouth 2 (two) times daily.   escitalopram 10 MG tablet Commonly known as: LEXAPRO Take 10 mg by mouth daily.   gabapentin 300 MG capsule Commonly known as: NEURONTIN Take 300 mg by mouth 4 (four) times daily.   hydrochlorothiazide 25 MG  tablet Commonly known as: HYDRODIURIL Take 25 mg by mouth daily.   HYDROcodone-acetaminophen 5-325 MG tablet Commonly known as: NORCO/VICODIN Take 1 tablet by mouth every 4 (four) hours as needed for moderate pain (pain score 4-6).   losartan 50 MG tablet Commonly known as: COZAAR Take 50 mg by mouth daily.   metFORMIN 500 MG 24 hr tablet Commonly known as: GLUCOPHAGE-XR Take 500 mg by mouth daily with breakfast.   methocarbamol 500 MG tablet Commonly known as: ROBAXIN Take 1 tablet (500 mg total) by mouth every 6 (six) hours as needed for muscle spasms.   ondansetron 4 MG tablet Commonly known as: ZOFRAN Take 1 tablet (4 mg total) by mouth every 6 (six) hours as needed for nausea.   polyethylene glycol 17 g packet Commonly known as: MIRALAX / GLYCOLAX Take 17 g by mouth daily as needed for mild constipation.   senna 8.6 MG Tabs  tablet Commonly known as: SENOKOT Take 2 tablets (17.2 mg total) by mouth at bedtime.          WEIGHT BEARING   Weight bearing as tolerated with assist device (walker, cane, etc) as directed, use it as long as suggested by your surgeon or therapist, typically at least 4-6 weeks.   EXERCISES  Results after joint replacement surgery are often greatly improved when you follow the exercise, range of motion and muscle strengthening exercises prescribed by your doctor. Safety measures are also important to protect the joint from further injury. Any time any of these exercises cause you to have increased pain or swelling, decrease what you are doing until you are comfortable again and then slowly increase them. If you have problems or questions, call your caregiver or physical therapist for advice.   Rehabilitation is important following a joint replacement. After just a few days of immobilization, the muscles of the leg can become weakened and shrink (atrophy).  These exercises are designed to build up the tone and strength of the thigh and leg muscles and to improve motion. Often times heat used for twenty to thirty minutes before working out will loosen up your tissues and help with improving the range of motion but do not use heat for the first two weeks following surgery (sometimes heat can increase post-operative swelling).   These exercises can be done on a training (exercise) mat, on the floor, on a table or on a bed. Use whatever works the best and is most comfortable for you.    Use music or television while you are exercising so that the exercises are a pleasant break in your day. This will make your life better with the exercises acting as a break in your routine that you can look forward to.   Perform all exercises about fifteen times, three times per day or as directed.  You should exercise both the operative leg and the other leg as well.  Exercises include:   Quad Sets - Tighten up  the muscle on the front of the thigh (Quad) and hold for 5-10 seconds.   Straight Leg Raises - With your knee straight (if you were given a brace, keep it on), lift the leg to 60 degrees, hold for 3 seconds, and slowly lower the leg.  Perform this exercise against resistance later as your leg gets stronger.  Leg Slides: Lying on your back, slowly slide your foot toward your buttocks, bending your knee up off the floor (only go as far as is comfortable). Then slowly slide your foot  back down until your leg is flat on the floor again.  Angel Wings: Lying on your back spread your legs to the side as far apart as you can without causing discomfort.  Hamstring Strength:  Lying on your back, push your heel against the floor with your leg straight by tightening up the muscles of your buttocks.  Repeat, but this time bend your knee to a comfortable angle, and push your heel against the floor.  You may put a pillow under the heel to make it more comfortable if necessary.   A rehabilitation program following joint replacement surgery can speed recovery and prevent re-injury in the future due to weakened muscles. Contact your doctor or a physical therapist for more information on knee rehabilitation.    CONSTIPATION  Constipation is defined medically as fewer than three stools per week and severe constipation as less than one stool per week.  Even if you have a regular bowel pattern at home, your normal regimen is likely to be disrupted due to multiple reasons following surgery.  Combination of anesthesia, postoperative narcotics, change in appetite and fluid intake all can affect your bowels.   YOU MUST use at least one of the following options; they are listed in order of increasing strength to get the job done.  They are all available over the counter, and you may need to use some, POSSIBLY even all of these options:    Drink plenty of fluids (prune juice may be helpful) and high fiber foods Colace 100 mg by  mouth twice a day  Senokot for constipation as directed and as needed Dulcolax (bisacodyl), take with full glass of water  Miralax (polyethylene glycol) once or twice a day as needed.  If you have tried all these things and are unable to have a bowel movement in the first 3-4 days after surgery call either your surgeon or your primary doctor.    If you experience loose stools or diarrhea, hold the medications until you stool forms back up.  If your symptoms do not get better within 1 week or if they get worse, check with your doctor.  If you experience "the worst abdominal pain ever" or develop nausea or vomiting, please contact the office immediately for further recommendations for treatment.   ITCHING:  If you experience itching with your medications, try taking only a single pain pill, or even half a pain pill at a time.  You can also use Benadryl over the counter for itching or also to help with sleep.   TED HOSE STOCKINGS:  Use stockings on both legs until for at least 2 weeks or as directed by physician office. They may be removed at night for sleeping.  MEDICATIONS:  See your medication summary on the "After Visit Summary" that nursing will review with you.  You may have some home medications which will be placed on hold until you complete the course of blood thinner medication.  It is important for you to complete the blood thinner medication as prescribed.  PRECAUTIONS:  If you experience chest pain or shortness of breath - call 911 immediately for transfer to the hospital emergency department.   If you develop a fever greater that 101 F, purulent drainage from wound, increased redness or drainage from wound, foul odor from the wound/dressing, or calf pain - CONTACT YOUR SURGEON.  FOLLOW-UP APPOINTMENTS:  If you do not already have a post-op appointment, please call the office for an appointment to be seen by your surgeon.  Guidelines for  how soon to be seen are listed in your "After Visit Summary", but are typically between 1-4 weeks after surgery.  OTHER INSTRUCTIONS:   Knee Replacement:  Do not place pillow under knee, focus on keeping the knee straight while resting. CPM instructions: 0-90 degrees, 2 hours in the morning, 2 hours in the afternoon, and 2 hours in the evening. Place foam block, curve side up under heel at all times except when in CPM or when walking.  DO NOT modify, tear, cut, or change the foam block in any way.   MAKE SURE YOU:  Understand these instructions.  Get help right away if you are not doing well or get worse.    Thank you for letting us be a part of your medical care team.  It is a privilege we respect greatly.  We hope these instructions will help you stay on track for a fast and full recovery!   Diagnostic Studies: DG Pelvis Portable  Result Date: 04/05/2023 CLINICAL DATA:  Status post left hip arthroplasty. EXAM: PORTABLE PELVIS 1-2 VIEWS COMPARISON:  None Available. FINDINGS: The left femoral and acetabular components are well situated. Expected postoperative changes are seen in the surrounding soft tissues. IMPRESSION: Status post left total hip arthroplasty. Electronically Signed   By: Lupita Raider M.D.   On: 04/05/2023 10:49   DG C-Arm 1-60 Min-No Report  Result Date: 04/05/2023 CLINICAL DATA:  Left total hip replacement. EXAM: DG HIP (WITH OR WITHOUT PELVIS) 2-3V LEFT; DG C-ARM 1-60 MIN-NO REPORT Radiation exposure index: 1.896 mGy COMPARISON:  Jan 19, 2022. FINDINGS: Two intraoperative fluoroscopic images were obtained of the left hip. Left femoral and acetabular components appear to be well situated. IMPRESSION: Fluoroscopic guidance provided during left total hip arthroplasty. Electronically Signed   By: Lupita Raider M.D.   On: 04/05/2023 09:51   DG HIP UNILAT WITH PELVIS 2-3 VIEWS LEFT  Result Date: 04/05/2023 CLINICAL DATA:  Left total hip replacement. EXAM: DG HIP (WITH OR  WITHOUT PELVIS) 2-3V LEFT; DG C-ARM 1-60 MIN-NO REPORT Radiation exposure index: 1.896 mGy COMPARISON:  Jan 19, 2022. FINDINGS: Two intraoperative fluoroscopic images were obtained of the left hip. Left femoral and acetabular components appear to be well situated. IMPRESSION: Fluoroscopic guidance provided during left total hip arthroplasty. Electronically Signed   By: Lupita Raider M.D.   On: 04/05/2023 09:51   DG C-Arm 1-60 Min-No Report  Result Date: 04/05/2023 CLINICAL DATA:  Left total hip replacement. EXAM: DG HIP (WITH OR WITHOUT PELVIS) 2-3V LEFT; DG C-ARM 1-60 MIN-NO REPORT Radiation exposure index: 1.896 mGy COMPARISON:  Jan 19, 2022. FINDINGS: Two intraoperative fluoroscopic images were obtained of the left hip. Left femoral and acetabular components appear to be well situated. IMPRESSION: Fluoroscopic guidance provided during left total hip arthroplasty. Electronically Signed   By: Lupita Raider M.D.   On: 04/05/2023 09:51   DG C-Arm 1-60 Min-No Report  Result Date: 04/05/2023 CLINICAL DATA:  Left total hip replacement. EXAM: DG HIP (WITH OR WITHOUT PELVIS) 2-3V LEFT; DG C-ARM 1-60 MIN-NO REPORT Radiation exposure index: 1.896 mGy COMPARISON:  Jan 19, 2022. FINDINGS: Two intraoperative fluoroscopic images were obtained of the left hip. Left femoral and acetabular components appear to be well situated. IMPRESSION: Fluoroscopic guidance provided during left total hip arthroplasty. Electronically Signed   By: Fayrene Fearing  Christen Butter M.D.   On: 04/05/2023 09:51   ECHOCARDIOGRAM COMPLETE  Result Date: 03/14/2023    ECHOCARDIOGRAM REPORT   Patient Name:   EMERICK MATZINGER [facility] Date of Exam: 03/14/2023 Medical Rec #:  [id number]        Height:       74.0 in Accession #:    [id number]       Weight:       268.0 lb Date of Birth:  01-09-1951       BSA:          2.461 m Patient Age:    71 years         BP:           145/95 mmHg Patient Gender: M                HR:           74 bpm. Exam Location:  Outpatient  Procedure: 2D Echo, 3D Echo, Cardiac Doppler and Color Doppler Indications:    R60.0 Lower extremity edema  History:        Patient has no prior history of Echocardiogram examinations.                 Signs/Symptoms:Edema; Risk Factors:Hypertension and Diabetes.                 Patient denies chest pain and SOB. He does have chronic                 bilateral leg edema. Patient has a fracture in left hip, pending                 hip replacement in 03/2023.  Sonographer:    Carlos American RVT, RDCS (AE), RDMS Referring Phys: [id number] JOHN BREAM  Sonographer Comments: Patient is obese. Image acquisition challenging due to patient body habitus. IMPRESSIONS  1. Left ventricular ejection fraction, by estimation, is 60 to 65%. The left ventricle has normal function. The left ventricle has no regional wall motion abnormalities. Left ventricular diastolic parameters were normal.  2. Right ventricular systolic function is normal. The right ventricular size is normal.  3. Left atrial size was mild to moderately dilated.  4. The mitral valve is abnormal. Trivial mitral valve regurgitation. No evidence of mitral stenosis.  5. The aortic valve is tricuspid. There is moderate calcification of the aortic valve. There is moderate thickening of the aortic valve. Aortic valve regurgitation is mild. Aortic valve sclerosis is present, with no evidence of aortic valve stenosis.  6. Aortic dilatation noted. There is moderate dilatation of the ascending aorta, measuring 43 mm.  7. The inferior vena cava is normal in size with greater than 50% respiratory variability, suggesting right atrial pressure of 3 mmHg. FINDINGS  Left Ventricle: Left ventricular ejection fraction, by estimation, is 60 to 65%. The left ventricle has normal function. The left ventricle has no regional wall motion abnormalities. The left ventricular internal cavity size was normal in size. There is  no left ventricular hypertrophy. Left ventricular diastolic parameters  were normal. Right Ventricle: The right ventricular size is normal. No increase in right ventricular wall thickness. Right ventricular systolic function is normal. Left Atrium: Left atrial size was mild to moderately dilated. Right Atrium: Right atrial size was normal in size. Pericardium: There is no evidence of pericardial effusion. Mitral Valve: The mitral valve is abnormal. There is mild thickening of the mitral valve leaflet(s). Trivial mitral valve regurgitation. No evidence of mitral  valve stenosis. Tricuspid Valve: The tricuspid valve is normal in structure. Tricuspid valve regurgitation is not demonstrated. No evidence of tricuspid stenosis. Aortic Valve: The aortic valve is tricuspid. There is moderate calcification of the aortic valve. There is moderate thickening of the aortic valve. Aortic valve regurgitation is mild. Aortic regurgitation PHT measures 455 msec. Aortic valve sclerosis is present, with no evidence of aortic valve stenosis. Aortic valve mean gradient measures 5.0 mmHg. Aortic valve peak gradient measures 9.6 mmHg. Aortic valve area, by VTI measures 2.24 cm. Pulmonic Valve: The pulmonic valve was normal in structure. Pulmonic valve regurgitation is trivial. No evidence of pulmonic stenosis. Aorta: Aortic dilatation noted. There is moderate dilatation of the ascending aorta, measuring 43 mm. Venous: The inferior vena cava is normal in size with greater than 50% respiratory variability, suggesting right atrial pressure of 3 mmHg. IAS/Shunts: The interatrial septum was not well visualized.  LEFT VENTRICLE PLAX 2D LVIDd:         5.42 cm   Diastology LVIDs:         3.69 cm   LV e' medial:    8.10 cm/s LV PW:         1.11 cm   LV E/e' medial:  11.1 LV IVS:        1.18 cm   LV e' lateral:   15.20 cm/s LVOT diam:     2.30 cm   LV E/e' lateral: 5.9 LV SV:         75 LV SV Index:   31 LVOT Area:     4.15 cm                           3D Volume EF:                          3D EF:        61 %                           LV EDV:       173 ml                          LV ESV:       67 ml                          LV SV:        106 ml RIGHT VENTRICLE RV S prime:     16.10 cm/s TAPSE (M-mode): 2.7 cm LEFT ATRIUM             Index        RIGHT ATRIUM           Index LA diam:        4.30 cm 1.75 cm/m   RA Area:     21.40 cm LA Vol (A2C):   77.2 ml 31.36 ml/m  RA Volume:   58.50 ml  23.77 ml/m LA Vol (A4C):   73.5 ml 29.86 ml/m LA Biplane Vol: 77.0 ml 31.28 ml/m  AORTIC VALVE                     PULMONIC VALVE AV Area (Vmax):    2.28 cm      PV Vmax:  1.15 m/s AV Area (Vmean):   2.21 cm      PV Peak grad:     5.3 mmHg AV Area (VTI):     2.24 cm      PR End Diast Vel: 1.09 msec AV Vmax:           155.00 cm/s AV Vmean:          109.000 cm/s AV VTI:            0.336 m AV Peak Grad:      9.6 mmHg AV Mean Grad:      5.0 mmHg LVOT Vmax:         85.10 cm/s LVOT Vmean:        57.900 cm/s LVOT VTI:          0.181 m LVOT/AV VTI ratio: 0.54 AI PHT:            455 msec AR Vena Contracta: 0.25 cm  AORTA Ao Root diam: 3.60 cm Ao Asc diam:  4.30 cm Ao Arch diam: 3.2 cm MITRAL VALVE               TRICUSPID VALVE MV Area (PHT): 3.28 cm    TR Peak grad:   33.6 mmHg MV Decel Time: 231 msec    TR Vmax:        290.00 cm/s MV E velocity: 90.30 cm/s MV A velocity: 81.70 cm/s  SHUNTS MV E/A ratio:  1.11        Systemic VTI:  0.18 m                            Systemic Diam: 2.30 cm Charlton Haws MD Electronically signed by Charlton Haws MD Signature Date/Time: 03/14/2023/12:24:20 PM    Final     Disposition: Discharge disposition: 01-Home or Self Care       Discharge Instructions     Call MD / Call 911   Complete by: As directed    If you experience chest pain or shortness of breath, CALL 911 and be transported to the hospital emergency room.  If you develope a fever above 101 F, pus (white drainage) or increased drainage or redness at the wound, or calf pain, call your surgeon's office.   Constipation Prevention    Complete by: As directed    Drink plenty of fluids.  Prune juice may be helpful.  You may use a stool softener, such as Colace (over the counter) 100 mg twice a day.  Use MiraLax (over the counter) for constipation as needed.   Diet - low sodium heart healthy   Complete by: As directed    Driving restrictions   Complete by: As directed    No driving for 6 weeks   Increase activity slowly as tolerated   Complete by: As directed    Lifting restrictions   Complete by: As directed    No lifting for 6 weeks   Post-operative opioid taper instructions:   Complete by: As directed    POST-OPERATIVE OPIOID TAPER INSTRUCTIONS: It is important to wean off of your opioid medication as soon as possible. If you do not need pain medication after your surgery it is ok to stop day one. Opioids include: Codeine, Hydrocodone(Norco, Vicodin), Oxycodone(Percocet, oxycontin) and hydromorphone amongst others.  Long term and even short term use of opiods can cause: Increased pain response Dependence Constipation Depression Respiratory depression And more.  Withdrawal symptoms can include  Flu like symptoms Nausea, vomiting And more Techniques to manage these symptoms Hydrate well Eat regular healthy meals Stay active Use relaxation techniques(deep breathing, meditating, yoga) Do Not substitute Alcohol to help with tapering If you have been on opioids for less than two weeks and do not have pain than it is ok to stop all together.  Plan to wean off of opioids This plan should start within one week post op of your joint replacement. Maintain the same interval or time between taking each dose and first decrease the dose.  Cut the total daily intake of opioids by one tablet each day Next start to increase the time between doses. The last dose that should be eliminated is the evening dose.      TED hose   Complete by: As directed    Use stockings (TED hose) for 2 weeks on both leg(s).  You may remove  them at night for sleeping.        Follow-up Information     Clois Dupes, [place]. Schedule an appointment as soon as possible for a visit in 2 week(s).   Specialty: Orthopedic Surgery Why: For wound re-check Contact information: [address] [place] [address] [phone]                  Signed: Iline Oven Timica Marcom 04/06/2023, 12:03 PM

## 2023-12-03 ENCOUNTER — Encounter (HOSPITAL_COMMUNITY): Payer: Self-pay | Admitting: Physician Assistant

## 2024-01-01 ENCOUNTER — Other Ambulatory Visit: Payer: Self-pay | Admitting: Physician Assistant

## 2024-01-01 DIAGNOSIS — H9120 Sudden idiopathic hearing loss, unspecified ear: Secondary | ICD-10-CM

## 2024-01-03 ENCOUNTER — Other Ambulatory Visit (HOSPITAL_BASED_OUTPATIENT_CLINIC_OR_DEPARTMENT_OTHER): Payer: Self-pay | Admitting: Emergency Medicine

## 2024-01-03 DIAGNOSIS — H81392 Other peripheral vertigo, left ear: Secondary | ICD-10-CM

## 2024-01-16 ENCOUNTER — Other Ambulatory Visit (HOSPITAL_BASED_OUTPATIENT_CLINIC_OR_DEPARTMENT_OTHER): Payer: Self-pay

## 2024-01-16 DIAGNOSIS — R0609 Other forms of dyspnea: Secondary | ICD-10-CM

## 2024-01-31 ENCOUNTER — Ambulatory Visit (HOSPITAL_BASED_OUTPATIENT_CLINIC_OR_DEPARTMENT_OTHER)
Admission: RE | Admit: 2024-01-31 | Discharge: 2024-01-31 | Disposition: A | Discharge status: Home / Self Care | Source: Ambulatory Visit | Attending: Emergency Medicine | Admitting: Emergency Medicine

## 2024-01-31 DIAGNOSIS — H81392 Other peripheral vertigo, left ear: Secondary | ICD-10-CM | POA: Diagnosis present
